# Patient Record
Sex: Female | Born: 1995 | Race: White | Hispanic: No | Marital: Single | Smoking: Never smoker
Health system: Southern US, Community
[De-identification: ages and names within clinical notes are randomized; demographics above are authoritative.]

## PROBLEM LIST (undated history)

## (undated) HISTORY — PX: STRABISMUS SURGERY: SHX218

---
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
## 2019-07-04 ENCOUNTER — Other Ambulatory Visit: Payer: Self-pay

## 2019-07-04 DIAGNOSIS — Z20822 Contact with and (suspected) exposure to covid-19: Secondary | ICD-10-CM

## 2019-07-06 LAB — NOVEL CORONAVIRUS, NAA: SARS-CoV-2, NAA: NOT DETECTED

## 2019-11-21 ENCOUNTER — Other Ambulatory Visit: Payer: Self-pay

## 2019-11-21 ENCOUNTER — Ambulatory Visit
Admission: EM | Admit: 2019-11-21 | Discharge: 2019-11-21 | Disposition: A | Discharge status: Home / Self Care | Payer: Worker's Compensation | Attending: Emergency Medicine | Admitting: Emergency Medicine

## 2019-11-21 ENCOUNTER — Ambulatory Visit: Payer: Self-pay

## 2019-11-21 ENCOUNTER — Ambulatory Visit (INDEPENDENT_AMBULATORY_CARE_PROVIDER_SITE_OTHER): Payer: Worker's Compensation

## 2019-11-21 DIAGNOSIS — S6991XA Unspecified injury of right wrist, hand and finger(s), initial encounter: Secondary | ICD-10-CM | POA: Diagnosis not present

## 2019-11-21 DIAGNOSIS — S8991XA Unspecified injury of right lower leg, initial encounter: Secondary | ICD-10-CM

## 2019-11-21 DIAGNOSIS — M25561 Pain in right knee: Secondary | ICD-10-CM

## 2019-11-21 DIAGNOSIS — M79641 Pain in right hand: Secondary | ICD-10-CM

## 2019-11-21 DIAGNOSIS — W19XXXA Unspecified fall, initial encounter: Secondary | ICD-10-CM

## 2019-11-21 DIAGNOSIS — S46812A Strain of other muscles, fascia and tendons at shoulder and upper arm level, left arm, initial encounter: Secondary | ICD-10-CM

## 2019-11-21 MED ORDER — CYCLOBENZAPRINE HCL 10 MG PO TABS: 10.0000 mg | ORAL_TABLET | Freq: Every day | ORAL | 0 refills | Status: DC

## 2019-11-21 MED ORDER — NAPROXEN 500 MG PO TABS: 500.0000 mg | ORAL_TABLET | Freq: Two times a day (BID) | ORAL | 0 refills | Status: DC

## 2019-11-21 NOTE — Discharge Instructions (Addendum)
Continue conservative management of rest, ice, and elevation RT finger splint applied RT knee brace applied Take naproxen as needed for pain relief (may cause abdominal discomfort, ulcers, and GI bleeds avoid taking with other NSAIDs) Take cyclobenzaprine at nighttime for symptomatic relief. Avoid driving or operating heavy machinery while using medication. Follow up with PCP or orthopedist for further evaluation and management Return or go to the ER if you have any new or worsening symptoms (fever, chills, chest pain, redness, swelling, bruising, worsening symptoms despite treatment, etc...)

## 2019-11-21 NOTE — ED Triage Notes (Signed)
Pt fell while at work after tripping on carpet , pt has right knee, right hand and left shoulder pain. Bruising to right first finger noted

## 2019-11-21 NOTE — ED Provider Notes (Signed)
[facility]   [id number] 11/21/19 Arrival Time: 1801  CC: Fall  SUBJECTIVE: History from: patient. Brooke Paul is a 24 y.o. female complains of LT side of neck, RT little finger, and RT knee pain that occurred today. RT finger and RT knee most painful. Symptoms began after fall at work.  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  Localizes the pain to the LT side of neck, RT little finger, and RT knee.  Describes the pain as intermittent and throbbing in character.  Has not tried OTC medication.  Symptoms are made worse with ROM.  Denies similar symptoms in the past.  Complains of associated swelling and bruising.  Denies fever, chills, erythema, weakness, numbness and tingling.  ROS: As per HPI.  All other pertinent ROS negative.     History reviewed. No pertinent past medical history. Past Surgical History:  Procedure Laterality Date  . STRABISMUS SURGERY     No Known Allergies No current facility-administered medications on file prior to encounter.   No current outpatient medications on file prior to encounter.   Social History   Socioeconomic History  . Marital status: Single    Spouse name: Not on file  . Number of children: Not on file  . Years of education: Not on file  . Highest education level: Not on file  Occupational History  . Not on file  Tobacco Use  . Smoking status: Never Smoker  . Smokeless tobacco: Never Used  Substance and Sexual Activity  . Alcohol use: Never  . Drug use: Never  . Sexual activity: Not on file  Other Topics Concern  . Not on file  Social History Narrative  . Not on file   Social Determinants of Health   Financial Resource Strain:   . Difficulty of Paying Living Expenses: Not on file  Food Insecurity:   . Worried About Charity fundraiser in the Last Year: Not on file  . Ran Out of Food in the Last Year: Not on file    Transportation Needs:   . Lack of Transportation (Medical): Not on file  . Lack of Transportation (Non-Medical): Not on file  Physical Activity:   . Days of Exercise per Week: Not on file  . Minutes of Exercise per Session: Not on file  Stress:   . Feeling of Stress : Not on file  Social Connections:   . Frequency of Communication with Friends and Family: Not on file  . Frequency of Social Gatherings with Friends and Family: Not on file  . Attends Religious Services: Not on file  . Active Member of Clubs or Organizations: Not on file  . Attends Archivist Meetings: Not on file  . Marital Status: Not on file  Intimate Partner Violence:   . Fear of Current or Ex-Partner: Not on file  . Emotionally Abused: Not on file  . Physically Abused: Not on file  . Sexually Abused: Not on file   Family History  Problem Relation Age of Onset  . Healthy Mother   . Healthy Father     OBJECTIVE:  Vitals:   11/21/19 1818  BP: 125/80  Pulse: 83  Resp: 18  Temp: 97.9 F (36.6 C)  SpO2: 99%    General appearance: ALERT; in no acute distress.  Head: NCAT Lungs: Normal respiratory effort; CTAB CV: RRR; radial pulse 2+ Musculoskeletal: Neck/ RT  hand/ RT knee Inspection: Ecchymosis and deformity appreciated to fifth distal digit of RT hand; no obvious swelling, ecchymosis, or wound to LT neck/ shoulder or RT knee Palpation: TTP over LT medial aspect of superior trapezius; TTP distal fifth digit of RT hand; diffusely TTP over lateral and anterior RT knee ROM: FROM active and passive Strength: 5/5 shld abduction, 5/5 shld adduction, deferred grip strength, 5/5 hip flexion, 5/5 knee abduction, 5/5 knee adduction, 5/5 knee flexion, 5/5 knee extension Skin: warm and dry Neurologic: Ambulates with antalgic gait; Sensation intact about the upper/ lower extremities Psychological: alert and cooperative; normal mood and affect  DIAGNOSTIC STUDIES:  DG Knee Complete 4 Views  Right  Result Date: 11/21/2019 CLINICAL DATA:  Pain status post fall EXAM: RIGHT KNEE - COMPLETE 4+ VIEW COMPARISON:  None. FINDINGS: No evidence of fracture, dislocation, or joint effusion. No evidence of arthropathy or other focal bone abnormality. Soft tissues are unremarkable. IMPRESSION: Negative. Electronically Signed   By: Katherine Mantle M.D.   On: 11/21/2019 19:01   DG Hand Complete Right  Result Date: 11/21/2019 CLINICAL DATA:  Fall. EXAM: RIGHT HAND - COMPLETE 3+ VIEW COMPARISON:  None. FINDINGS: There is no evidence of fracture or dislocation. There is no evidence of arthropathy or other focal bone abnormality. Soft tissues are unremarkable. IMPRESSION: Negative. Electronically Signed   By: Katherine Mantle M.D.   On: 11/21/2019 19:00    RT knee and hand X-rays negative for bony abnormalities including fracture, or dislocation.  No soft tissue swelling.    I have reviewed the x-rays myself and the radiologist interpretation. I am in agreement with the radiologist interpretation.     ASSESSMENT & PLAN:  1. Finger injury, right, initial encounter   2. Injury of right knee, initial encounter   3. Trapezius strain, left, initial encounter   4. Fall, initial encounter     Meds ordered this encounter  Medications  . naproxen (NAPROSYN) 500 MG tablet    Sig: Take 1 tablet (500 mg total) by mouth 2 (two) times daily.    Dispense:  30 tablet    Refill:  0    Order Specific Question:   Supervising Provider    Answer:   Eustace Moore [[id number]]  . cyclobenzaprine (FLEXERIL) 10 MG tablet    Sig: Take 1 tablet (10 mg total) by mouth at bedtime.    Dispense:  15 tablet    Refill:  0    Order Specific Question:   Supervising Provider    Answer:   Eustace Moore [[id number]]   Continue conservative management of rest, ice, and elevation RT finger splint applied RT knee brace applied Take naproxen as needed for pain relief (may cause abdominal discomfort, ulcers, and GI  bleeds avoid taking with other NSAIDs) Take cyclobenzaprine at nighttime for symptomatic relief. Avoid driving or operating heavy machinery while using medication. Follow up with PCP or orthopedist for further evaluation and management Return or go to the ER if you have any new or worsening symptoms (fever, chills, chest pain, redness, swelling, bruising, worsening symptoms despite treatment, etc...)   Reviewed expectations re: course of current medical issues. Questions answered. Outlined signs and symptoms indicating need for more acute intervention. Patient verbalized understanding. After Visit Summary given.    Rennis Harding, PA-C 11/21/19 1911

## 2019-11-25 ENCOUNTER — Telehealth: Payer: Self-pay | Admitting: Orthopedic Surgery

## 2019-11-25 NOTE — Telephone Encounter (Signed)
I did speak to [facility] and she understands that in order for [place] to file the [employer] we will have to get authorization from them.  I told her that I would call her as soon as I heard back from them.

## 2019-11-25 NOTE — Telephone Encounter (Signed)
Patient's mother Brooke Paul called and wanted to schedule an appointment for [name].  She said that [name] fell while at work and went to Urgent Care.  I told her that I really needed to speak to [name] but she said that she was calling from her work and that [name] was at home.  She asked that I call [name] and speak to [place] or [name] regarding this situation.  I did call and I spoke to [name].  She said that [name] got hurt on Thursday but did not fill out any of their forms.  She said they did receive the form back today from [name].  [name] then emailed it to their [employer].  She said she called and verified that they did receive the form but that it has not been assigned a claim number or adjustor as of this afternoon.  I will try to speak to [name] to let her know what's going on.

## 2019-11-28 NOTE — Telephone Encounter (Signed)
I did call the church yesterday to speak to Eusebio Friendly but she was out of the office for the day.  As of now, I still have not heard back from the patient, the church nor the [employer].  Still waiting to speak to someone regarding this patient to get approval for her to be seen here in our office.

## 2019-12-03 ENCOUNTER — Ambulatory Visit (INDEPENDENT_AMBULATORY_CARE_PROVIDER_SITE_OTHER): Payer: Worker's Compensation | Admitting: Orthopaedic Surgery

## 2019-12-03 ENCOUNTER — Encounter: Payer: Self-pay | Admitting: Orthopaedic Surgery

## 2019-12-03 ENCOUNTER — Other Ambulatory Visit: Payer: Self-pay

## 2019-12-03 VITALS — BP 102/70 | HR 101 | Temp 98.1°F | Ht 64.5 in | Wt 181.0 lb

## 2019-12-03 DIAGNOSIS — M20011 Mallet finger of right finger(s): Secondary | ICD-10-CM | POA: Diagnosis not present

## 2019-12-03 NOTE — Progress Notes (Signed)
Subjective:    Patient ID: Brooke Paul, female    DOB: 1996/09/14, 24 y.o.   MRN: [id number]  HPI She had a fall on 11-21-19 and injured her right little finger and right knee and left neck.  She was seen in the ER.  I have reviewed the notes and x-rays.  I have independently reviewed and interpreted x-rays of this patient done at another site by another physician or qualified health professional.  She has pain of the right little finger now at the DIP joint.  It does not fully extend.  She has a mallet finger.  They applied a splint in the ER.   Review of Systems  Constitutional: Positive for activity change.  Musculoskeletal: Positive for arthralgias.  All other systems reviewed and are negative.  For Review of Systems, all other systems reviewed and are negative.  The following is a summary of the past history medically, past history surgically, known current medicines, social history and family history.  This information is gathered electronically by the computer from prior information and documentation.  I review this each visit and have found including this information at this point in the chart is beneficial and informative.   History reviewed. No pertinent past medical history.  Past Surgical History:  Procedure Laterality Date  . STRABISMUS SURGERY      Current Outpatient Medications on File Prior to Visit  Medication Sig Dispense Refill  . cyclobenzaprine (FLEXERIL) 10 MG tablet Take 1 tablet (10 mg total) by mouth at bedtime. (Patient not taking: Reported on 12/03/2019) 15 tablet 0  . naproxen (NAPROSYN) 500 MG tablet Take 1 tablet (500 mg total) by mouth 2 (two) times daily. (Patient not taking: Reported on 12/03/2019) 30 tablet 0   No current facility-administered medications on file prior to visit.    Social History   Socioeconomic History  . Marital status: Single    Spouse name: Not on file  . Number of children: Not on file  . Years of education: Not on  file  . Highest education level: Not on file  Occupational History  . Not on file  Tobacco Use  . Smoking status: Never Smoker  . Smokeless tobacco: Never Used  Substance and Sexual Activity  . Alcohol use: Never  . Drug use: Never  . Sexual activity: Not on file  Other Topics Concern  . Not on file  Social History Narrative  . Not on file   Social Determinants of Health   Financial Resource Strain:   . Difficulty of Paying Living Expenses: Not on file  Food Insecurity:   . Worried About Charity fundraiser in the Last Year: Not on file  . Ran Out of Food in the Last Year: Not on file  Transportation Needs:   . Lack of Transportation (Medical): Not on file  . Lack of Transportation (Non-Medical): Not on file  Physical Activity:   . Days of Exercise per Week: Not on file  . Minutes of Exercise per Session: Not on file  Stress:   . Feeling of Stress : Not on file  Social Connections:   . Frequency of Communication with Friends and Family: Not on file  . Frequency of Social Gatherings with Friends and Family: Not on file  . Attends Religious Services: Not on file  . Active Member of Clubs or Organizations: Not on file  . Attends Archivist Meetings: Not on file  . Marital Status: Not on file  Intimate  Partner Violence:   . Fear of Current or Ex-Partner: Not on file  . Emotionally Abused: Not on file  . Physically Abused: Not on file  . Sexually Abused: Not on file    Family History  Problem Relation Age of Onset  . Healthy Mother   . Healthy Father     BP 102/70   Pulse (!) 101   Temp 98.1 F (36.7 C)   Ht 5' 4.5" (1.638 m)   Wt 181 lb (82.1 kg)   BMI 30.59 kg/m   Body mass index is 30.59 kg/m.      Objective:   Physical Exam Vitals and nursing note reviewed.  Constitutional:      Appearance: She is well-developed.  HENT:     Head: Normocephalic and atraumatic.  Eyes:     Conjunctiva/sclera: Conjunctivae normal.     Pupils: Pupils are  equal, round, and reactive to light.  Cardiovascular:     Rate and Rhythm: Normal rate and regular rhythm.  Pulmonary:     Effort: Pulmonary effort is normal.  Abdominal:     Palpations: Abdomen is soft.  Musculoskeletal:       Hands:     Cervical back: Normal range of motion and neck supple.  Skin:    General: Skin is warm and dry.  Neurological:     Mental Status: She is alert and oriented to person, place, and time.     Cranial Nerves: No cranial nerve deficit.     Motor: No abnormal muscle tone.     Coordination: Coordination normal.     Deep Tendon Reflexes: Reflexes are normal and symmetric. Reflexes normal.  Psychiatric:        Behavior: Behavior normal.        Thought Content: Thought content normal.        Judgment: Judgment normal.           Assessment & Plan:   Encounter Diagnosis  Name Primary?  . 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 She will need to be splinted for six to eight weeks.  She may end up with a small extension lag.  Return in one week.  Call if any problem.  Precautions discussed.   Electronically Signed Darreld Mclean, MD 3/9/20213:47 PM

## 2019-12-17 ENCOUNTER — Ambulatory Visit (INDEPENDENT_AMBULATORY_CARE_PROVIDER_SITE_OTHER): Payer: Worker's Compensation | Admitting: Orthopaedic Surgery

## 2019-12-17 ENCOUNTER — Encounter: Payer: Self-pay | Admitting: Orthopaedic Surgery

## 2019-12-17 ENCOUNTER — Other Ambulatory Visit: Payer: Self-pay

## 2019-12-17 VITALS — BP 122/79 | HR 89 | Ht 64.5 in | Wt 178.0 lb

## 2019-12-17 DIAGNOSIS — M20011 Mallet finger of right finger(s): Secondary | ICD-10-CM | POA: Diagnosis not present

## 2019-12-17 NOTE — Progress Notes (Signed)
My finger tingles some times  She has been using the splint for the right little finger but not keeping her finger fully extended.  I have explained again how to do this.  Splint reapplied.  New tape given for her home use.  Skin is OK.  NV intact.  Encounter Diagnosis  Name Primary?  . 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  Continue splinting all the time.  Call if any problem.  Precautions discussed.   Electronically Signed Darreld Mclean, MD 3/23/20218:16 AM

## 2019-12-31 ENCOUNTER — Ambulatory Visit (INDEPENDENT_AMBULATORY_CARE_PROVIDER_SITE_OTHER): Payer: Worker's Compensation | Admitting: Orthopaedic Surgery

## 2019-12-31 ENCOUNTER — Encounter: Payer: Self-pay | Admitting: Orthopaedic Surgery

## 2019-12-31 ENCOUNTER — Other Ambulatory Visit: Payer: Self-pay

## 2019-12-31 VITALS — BP 111/63 | HR 94 | Ht 64.5 in | Wt 178.0 lb

## 2019-12-31 DIAGNOSIS — M20011 Mallet finger of right finger(s): Secondary | ICD-10-CM | POA: Diagnosis not present

## 2019-12-31 NOTE — Progress Notes (Signed)
She is not using the splint appropriately for the right little finger.  I have shown her before how to tape it and wear the splint all the time.    She comes in with the splint barely on and an extension lag of 20 degrees.  NV intact.  I have re-instructed her on how to use the splint.  It is reapplied properly.  Return in one week.  Encounter Diagnosis  Name Primary?  . 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  Precautions discussed.   Electronically Signed Darreld Mclean, MD 4/6/20218:26 AM

## 2020-01-07 ENCOUNTER — Encounter: Payer: Self-pay | Admitting: Orthopaedic Surgery

## 2020-01-07 ENCOUNTER — Other Ambulatory Visit: Payer: Self-pay

## 2020-01-07 ENCOUNTER — Ambulatory Visit (INDEPENDENT_AMBULATORY_CARE_PROVIDER_SITE_OTHER): Payer: Worker's Compensation | Admitting: Orthopaedic Surgery

## 2020-01-07 VITALS — BP 113/79 | HR 91 | Ht 64.5 in | Wt 183.0 lb

## 2020-01-07 DIAGNOSIS — M20011 Mallet finger of right finger(s): Secondary | ICD-10-CM | POA: Diagnosis not present

## 2020-01-07 NOTE — Progress Notes (Signed)
My finger is better  She has been using the splint all the time now. She still has some extension lag of about 10 degrees.    I re-taped her finger with the splint.  Encounter Diagnosis  Name Primary?  . 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  Call if any problem.  Precautions discussed.   Electronically Signed Darreld Mclean, MD 4/13/20218:30 AM

## 2020-01-21 ENCOUNTER — Ambulatory Visit (INDEPENDENT_AMBULATORY_CARE_PROVIDER_SITE_OTHER): Payer: Worker's Compensation | Admitting: Orthopaedic Surgery

## 2020-01-21 ENCOUNTER — Encounter: Payer: Self-pay | Admitting: Orthopaedic Surgery

## 2020-01-21 ENCOUNTER — Ambulatory Visit: Payer: Worker's Compensation

## 2020-01-21 ENCOUNTER — Other Ambulatory Visit: Payer: Self-pay

## 2020-01-21 VITALS — BP 121/78 | HR 81 | Ht 64.5 in | Wt 183.0 lb

## 2020-01-21 DIAGNOSIS — M20011 Mallet finger of right finger(s): Secondary | ICD-10-CM | POA: Diagnosis not present

## 2020-01-21 NOTE — Progress Notes (Signed)
My finger is a little sore  She still has an extension lag of the little finger on the right of about 12 degrees.  NV intact.    X-rays were done, reported separately.  Encounter Diagnosis  Name Primary?  . 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  Use hand creme on the finger.  Return in two weeks.  I have offered for her to see hand surgeon.  Call if any problem.  Precautions discussed.   Electronically Signed Darreld Mclean, MD 4/27/20218:44 AM

## 2020-02-04 ENCOUNTER — Ambulatory Visit (INDEPENDENT_AMBULATORY_CARE_PROVIDER_SITE_OTHER): Payer: Worker's Compensation | Admitting: Orthopaedic Surgery

## 2020-02-04 ENCOUNTER — Encounter: Payer: Self-pay | Admitting: Orthopaedic Surgery

## 2020-02-04 ENCOUNTER — Other Ambulatory Visit: Payer: Self-pay

## 2020-02-04 VITALS — BP 101/83 | HR 82 | Temp 97.6°F | Ht 64.5 in | Wt 187.4 lb

## 2020-02-04 DIAGNOSIS — M20011 Mallet finger of right finger(s): Secondary | ICD-10-CM

## 2020-02-04 NOTE — Progress Notes (Signed)
My finger does not hurt  She has a mallet deformity of about 12 to 15 degrees extension lag of the right dominant little finger.  I have told her this is permanent.  I have offered her to see a hand surgeon but she declines.    She has no pain, no redness.  NV intact.  Encounter Diagnosis  Name Primary?  . 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  Electronically Signed Darreld Mclean, MD 5/11/20218:40 AM

## 2020-12-17 ENCOUNTER — Ambulatory Visit
Admission: EM | Admit: 2020-12-17 | Discharge: 2020-12-17 | Disposition: A | Discharge status: Home / Self Care | Payer: BC Managed Care – PPO | Attending: Emergency Medicine | Admitting: Emergency Medicine

## 2020-12-17 ENCOUNTER — Other Ambulatory Visit: Payer: Self-pay

## 2020-12-17 ENCOUNTER — Encounter: Payer: Self-pay | Admitting: Emergency Medicine

## 2020-12-17 ENCOUNTER — Ambulatory Visit (INDEPENDENT_AMBULATORY_CARE_PROVIDER_SITE_OTHER): Payer: Self-pay

## 2020-12-17 DIAGNOSIS — S59912A Unspecified injury of left forearm, initial encounter: Secondary | ICD-10-CM

## 2020-12-17 DIAGNOSIS — M79632 Pain in left forearm: Secondary | ICD-10-CM

## 2020-12-17 DIAGNOSIS — W19XXXA Unspecified fall, initial encounter: Secondary | ICD-10-CM

## 2020-12-17 MED ORDER — MELOXICAM 15 MG PO TABS
15.0000 mg | ORAL_TABLET | Freq: Every day | ORAL | 0 refills | Status: DC
Start: 2020-12-17 — End: 2022-08-09

## 2020-12-17 NOTE — ED Triage Notes (Signed)
Larey Seat today on left arm. Pain from wrist to above elbow.

## 2020-12-17 NOTE — Discharge Instructions (Addendum)
X-rays negative for fracture or dislocation Ace bandage applied Continue conservative management of rest, ice, and elevation Take mobic  as needed for pain relief (may cause abdominal discomfort, ulcers, and GI bleeds avoid taking with other NSAIDs) Follow up with PCP if symptoms persist Return or go to the ER if you have any new or worsening symptoms (fever, chills, chest pain, redness, swelling, bruising, numbness/ tingling, etc...)

## 2020-12-17 NOTE — ED Provider Notes (Signed)
[facility]   [id number] 12/17/20 Arrival Time: 1706  CC: LT forearm PAIN  SUBJECTIVE: History from: patient. Jaqulyn Ceniyah Thorp is a 25 y.o. female complains of left arm pain that occurred today. Fall while at work on LT forearm.  Works at a daycare.  Localizes the pain to the forearm.  Describes the pain as constant and sharp in character.  Has NOT tried OTC medications.  Symptoms are made worse with ROM about the wrist.  Denies similar symptoms in the past.  Denies fever, chills, erythema, ecchymosis, effusion, weakness, numbness and tingling.  ROS: As per HPI.  All other pertinent ROS negative.     History reviewed. No pertinent past medical history. Past Surgical History:  Procedure Laterality Date  . STRABISMUS SURGERY     No Known Allergies No current facility-administered medications on file prior to encounter.   No current outpatient medications on file prior to encounter.   Social History   Socioeconomic History  . Marital status: Single    Spouse name: Not on file  . Number of children: Not on file  . Years of education: Not on file  . Highest education level: Not on file  Occupational History  . Not on file  Tobacco Use  . Smoking status: Never Smoker  . Smokeless tobacco: Never Used  Substance and Sexual Activity  . Alcohol use: Never  . Drug use: Never  . Sexual activity: Not on file  Other Topics Concern  . Not on file  Social History Narrative  . Not on file   Social Determinants of Health   Financial Resource Strain: Not on file  Food Insecurity: Not on file  Transportation Needs: Not on file  Physical Activity: Not on file  Stress: Not on file  Social Connections: Not on file  Intimate Partner Violence: Not on file   Family History  Problem Relation Age of Onset  . Healthy Mother   . Healthy Father     OBJECTIVE:  Vitals:   12/17/20 1727  BP: 102/67  Pulse: 67  Resp: 18  Temp: 97.6 F (36.4 C)  TempSrc: Oral  SpO2:  99%    General appearance: ALERT; in no acute distress.  Head: NCAT Lungs: Normal respiratory effort CV: Radial pulse 2+ Musculoskeletal: LT foreram Inspection: Skin warm, dry, clear and intact without obvious erythema, effusion, or ecchymosis.  Palpation: diffusely TTP over mid to proximal forearm ROM: FROM active and passive Strength: 4+/5 grip strength Skin: warm and dry Neurologic: Ambulates without difficulty; Sensation intact about the upper/ lower extremities Psychological: alert and cooperative; normal mood and affect  DIAGNOSTIC STUDIES:  DG Forearm Left  Result Date: 12/17/2020 CLINICAL DATA:  Pain following fall EXAM: LEFT FOREARM - 2 VIEW COMPARISON:  None. FINDINGS: Frontal and lateral views were obtained. No fracture or dislocation. Joint spaces appear normal. No erosive change. IMPRESSION: No fracture or dislocation.  No evident arthropathy. Electronically Signed   By: Bretta Bang III M.D.   On: 12/17/2020 17:49    X-rays negative for bony abnormalities including fracture, or dislocation.    I have reviewed the x-rays myself and the radiologist interpretation. I am in agreement with the radiologist interpretation.     ASSESSMENT & PLAN:  1. Left forearm pain   2. Forearm injury, left, initial encounter     Meds ordered this encounter  Medications  . meloxicam (MOBIC) 15 MG tablet    Sig: Take 1 tablet (15 mg total) by mouth daily.  Dispense:  30 tablet    Refill:  0    Order Specific Question:   Supervising Provider    Answer:   Eustace Moore [[id number]]   X-rays negative for fracture or dislocation Ace bandage applied Continue conservative management of rest, ice, and elevation Take mobic  as needed for pain relief (may cause abdominal discomfort, ulcers, and GI bleeds avoid taking with other NSAIDs) Follow up with PCP if symptoms persist Return or go to the ER if you have any new or worsening symptoms (fever, chills, chest pain, redness,  swelling, bruising, numbness/ tingling, etc...)   Reviewed expectations re: course of current medical issues. Questions answered. Outlined signs and symptoms indicating need for more acute intervention. Patient verbalized understanding. After Visit Summary given.    Rennis Harding, PA-C 12/17/20 1757

## 2021-03-02 ENCOUNTER — Other Ambulatory Visit: Payer: Self-pay

## 2021-03-02 ENCOUNTER — Ambulatory Visit (INDEPENDENT_AMBULATORY_CARE_PROVIDER_SITE_OTHER): Payer: Worker's Compensation | Admitting: Orthopaedic Surgery

## 2021-03-02 ENCOUNTER — Encounter: Payer: Self-pay | Admitting: Orthopaedic Surgery

## 2021-03-02 VITALS — BP 112/73 | HR 83 | Ht 64.5 in | Wt 198.0 lb

## 2021-03-02 DIAGNOSIS — M20011 Mallet finger of right finger(s): Secondary | ICD-10-CM | POA: Diagnosis not present

## 2021-03-02 NOTE — Progress Notes (Signed)
My finger droops.  She had a mallet finger on the right little finger at the DIP joint.  I saw her last year for this.  She has a decided lag of about 40 to 45 degrees on the right.  It bothers her.  I had mentioned about possible surgery for the finger.   She has a worker's compensation injury for the finger.  They wanted me to see her and see if surgery was needed.  Since she has continued problems and it gets in her way, an arthrodesis should be considered.  I have explained the procedure to the patient and her mother who is present.    I will have hand surgery to see her and see if she is a candidate for surgery of the mallet finger on the right little finger.  We need to get permission from worker's comp for the referral.  I will see her as needed.  Encounter Diagnosis  Name Primary?  . 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  Precautions discussed.   Electronically Signed Darreld Mclean, MD 6/7/202211:14 AM

## 2021-12-13 ENCOUNTER — Ambulatory Visit
Admission: RE | Admit: 2021-12-13 | Discharge: 2021-12-13 | Disposition: A | Discharge status: Home / Self Care | Payer: BC Managed Care – PPO | Source: Ambulatory Visit | Attending: Student | Admitting: Student

## 2021-12-13 ENCOUNTER — Other Ambulatory Visit: Payer: Self-pay

## 2021-12-13 VITALS — BP 120/82 | HR 103 | Temp 98.1°F | Resp 22

## 2021-12-13 DIAGNOSIS — J0100 Acute maxillary sinusitis, unspecified: Secondary | ICD-10-CM

## 2021-12-13 MED ORDER — ONDANSETRON 8 MG PO TBDP: 8.0000 mg | ORAL_TABLET | Freq: Three times a day (TID) | ORAL | 0 refills | Status: DC | PRN

## 2021-12-13 MED ORDER — AMOXICILLIN 875 MG PO TABS: 875.0000 mg | ORAL_TABLET | Freq: Two times a day (BID) | ORAL | 0 refills | Status: AC

## 2021-12-13 NOTE — ED Triage Notes (Signed)
Pt presents with c/o fever, vomiting and cough that began of Friday  ?

## 2021-12-13 NOTE — ED Provider Notes (Signed)
?[facility] ? ? ? ?CSN: [id number] ?Arrival date & time: 12/13/21  0844 ? ? ?  ? ?History   ?Chief Complaint ?Chief Complaint  ?Patient presents with  ? Emesis  ? Fever  ? ? ?HPI ?Brooke Paul is a 26 y.o. female presenting with nausea, vomiting, subjective chills.  History noncontributory.  Describes nausea with bilious vomiting and loose stool.  Minimal abdominal pain.  Tolerating fluids but not food.  Also with left ear pressure, without dizziness or hearing changes.  States she tried some over-the-counter drops without relief.  Notes nasal congestion and sinus pressure that seemed to start prior to the other symptoms, difficulty breathing out of her nose.  Denies shortness of breath, chest pain. ? ?HPI ? ?History reviewed. No pertinent past medical history. ? ?There are no problems to display for this patient. ? ? ?Past Surgical History:  ?Procedure Laterality Date  ? STRABISMUS SURGERY    ? ? ?OB History   ?No obstetric history on file. ?  ? ? ? ?Home Medications   ? ?Prior to Admission medications   ?Medication Sig Start Date End Date Taking? Authorizing Provider  ?amoxicillin (AMOXIL) 875 MG tablet Take 1 tablet (875 mg total) by mouth 2 (two) times daily for 7 days. 12/13/21 12/20/21 Yes Rhys Martini, PA-C  ?ondansetron (ZOFRAN-ODT) 8 MG disintegrating tablet Take 1 tablet (8 mg total) by mouth every 8 (eight) hours as needed for nausea or vomiting. 12/13/21  Yes Rhys Martini, PA-C  ?meloxicam (MOBIC) 15 MG tablet Take 1 tablet (15 mg total) by mouth daily. 12/17/20   Rennis Harding, PA-C  ? ? ?Family History ?Family History  ?Problem Relation Age of Onset  ? Healthy Mother   ? Healthy Father   ? ? ?Social History ?Social History  ? ?Tobacco Use  ? Smoking status: Never  ? Smokeless tobacco: Never  ?Substance Use Topics  ? Alcohol use: Never  ? Drug use: Never  ? ? ? ?Allergies   ?Patient has no known allergies. ? ? ?Review of Systems ?Review of Systems  ?HENT:  Positive for sinus  pressure.   ?Gastrointestinal:  Positive for nausea and vomiting.  ?All other systems reviewed and are negative. ? ? ?Physical Exam ?Triage Vital Signs ?ED Triage Vitals [12/13/21 0917]  ?Enc Vitals Group  ?   BP 120/82  ?   Pulse Rate (!) 103  ?   Resp (!) 22  ?   Temp 98.1 ?F (36.7 ?C)  ?   Temp src   ?   SpO2 96 %  ?   Weight   ?   Height   ?   Head Circumference   ?   Peak Flow   ?   Pain Score   ?   Pain Loc   ?   Pain Edu?   ?   Excl. in GC?   ? ?No data found. ? ?Updated Vital Signs ?BP 120/82   Pulse (!) 103   Temp 98.1 ?F (36.7 ?C)   Resp (!) 22   LMP 11/29/2021   SpO2 96%  ? ?Visual Acuity ?Right Eye Distance:   ?Left Eye Distance:   ?Bilateral Distance:   ? ?Right Eye Near:   ?Left Eye Near:    ?Bilateral Near:    ? ?Physical Exam ?Vitals reviewed.  ?Constitutional:   ?   General: She is not in acute distress. ?   Appearance: Normal appearance. She is not ill-appearing.  ?HENT:  ?  Head: Normocephalic and atraumatic.  ?   Right Ear: Hearing and ear canal normal. No swelling or tenderness. No middle ear effusion. There is impacted cerumen.  ?   Left Ear: Hearing, tympanic membrane, ear canal and external ear normal. No swelling or tenderness.  No middle ear effusion. There is impacted cerumen. No mastoid tenderness. Tympanic membrane is not injected, scarred, perforated, erythematous, retracted or bulging.  ?   Ears:  ?   Comments: R TM completely occluded by cerumen. L TM 60% occluded by cerumen. ?   Nose:  ?   Right Sinus: Maxillary sinus tenderness present. No frontal sinus tenderness.  ?   Left Sinus: Maxillary sinus tenderness present. No frontal sinus tenderness.  ?   Mouth/Throat:  ?   Mouth: Mucous membranes are moist.  ?   Pharynx: Oropharynx is clear. No oropharyngeal exudate or posterior oropharyngeal erythema.  ?   Comments: Moist mucous membranes ?Eyes:  ?   Extraocular Movements: Extraocular movements intact.  ?   Pupils: Pupils are equal, round, and reactive to light.  ?Cardiovascular:   ?   Rate and Rhythm: Normal rate and regular rhythm.  ?   Heart sounds: Normal heart sounds.  ?Pulmonary:  ?   Effort: Pulmonary effort is normal.  ?   Breath sounds: Normal breath sounds. No wheezing, rhonchi or rales.  ?Abdominal:  ?   General: Bowel sounds are normal. There is no distension.  ?   Palpations: Abdomen is soft. There is no mass.  ?   Tenderness: There is no abdominal tenderness. There is no right CVA tenderness, left CVA tenderness, guarding or rebound.  ?Lymphadenopathy:  ?   Cervical: No cervical adenopathy.  ?Skin: ?   General: Skin is warm.  ?   Capillary Refill: Capillary refill takes less than 2 seconds.  ?   Comments: Good skin turgor  ?Neurological:  ?   General: No focal deficit present.  ?   Mental Status: She is alert and oriented to person, place, and time.  ?Psychiatric:     ?   Mood and Affect: Mood normal.     ?   Behavior: Behavior normal.     ?   Thought Content: Thought content normal.     ?   Judgment: Judgment normal.  ? ? ? ?UC Treatments / Results  ?Labs ?(all labs ordered are listed, but only abnormal results are displayed) ?Labs Reviewed - No data to display ? ?EKG ? ? ?Radiology ?No results found. ? ?Procedures ?Procedures (including critical care time) ? ?Medications Ordered in UC ?Medications - No data to display ? ?Initial Impression / Assessment and Plan / UC Course  ?I have reviewed the triage vital signs and the nursing notes. ? ?Pertinent labs & imaging results that were available during my care of the patient were reviewed by me and considered in my medical decision making (see chart for details). ? ?  ? ?This patient is a very pleasant 26 y.o. year old female presenting with viral syndrome x4 days.  Afebrile, borderline tachycardic.  Appears fairly well-hydrated. LMP 11/29/21, States she is not pregnant or breastfeeding. ? ?Pt with maxillary sinus tenderness and congestion that started prior to onset of other symptoms. She denies allergic rhinitis; nonetheless I  suspect that her sinusitis may be related to this. Amoxicillin sent. Zofran for nausea and vomiting.  ? ?Work note provided. ED return precautions discussed. Patient verbalizes understanding and agreement.  ? ? ?Final Clinical Impressions(s) / UC Diagnoses  ? ?  Final diagnoses:  ?Acute non-recurrent maxillary sinusitis  ? ? ? ?Discharge Instructions   ? ?  ?-Amoxicillin twice daily x7 days. Take with food if you have a sensitive stomach . ?-Take the Zofran (ondansetron) up to 3 times daily for nausea and vomiting. Dissolve one pill under your tongue or between your teeth and your cheek. ?-Drink plenty of fluids and eat a bland diet  ?-With a virus, you're typically contagious for 5-7 days, or as long as you're having fevers.  ? ? ? ?ED Prescriptions   ? ? Medication Sig Dispense Auth. Provider  ? amoxicillin (AMOXIL) 875 MG tablet Take 1 tablet (875 mg total) by mouth 2 (two) times daily for 7 days. 14 tablet Rhys MartiniGraham, Naveen Lorusso E, PA-C  ? ondansetron (ZOFRAN-ODT) 8 MG disintegrating tablet Take 1 tablet (8 mg total) by mouth every 8 (eight) hours as needed for nausea or vomiting. 20 tablet Rhys MartiniGraham, Jackee Glasner E, PA-C  ? ?  ? ?PDMP not reviewed this encounter. ?  ?Rhys MartiniGraham, Tniya Bowditch E, PA-C ?12/13/21 [id number] ? ?

## 2021-12-13 NOTE — ED Triage Notes (Signed)
Pt reports 2 negative covid test  ?

## 2021-12-13 NOTE — Discharge Instructions (Addendum)
-  Amoxicillin twice daily x7 days. Take with food if you have a sensitive stomach . ?-Take the Zofran (ondansetron) up to 3 times daily for nausea and vomiting. Dissolve one pill under your tongue or between your teeth and your cheek. ?-Drink plenty of fluids and eat a bland diet  ?-With a virus, you're typically contagious for 5-7 days, or as long as you're having fevers.  ? ?

## 2022-04-25 IMAGING — DX DG FOREARM 2V*L*
2 series · 2 of 2 positions shown · non-contrast
Comparison: None.

CLINICAL DATA: Pain following fall

EXAM:
LEFT FOREARM - 2 VIEW

[forearm ap]
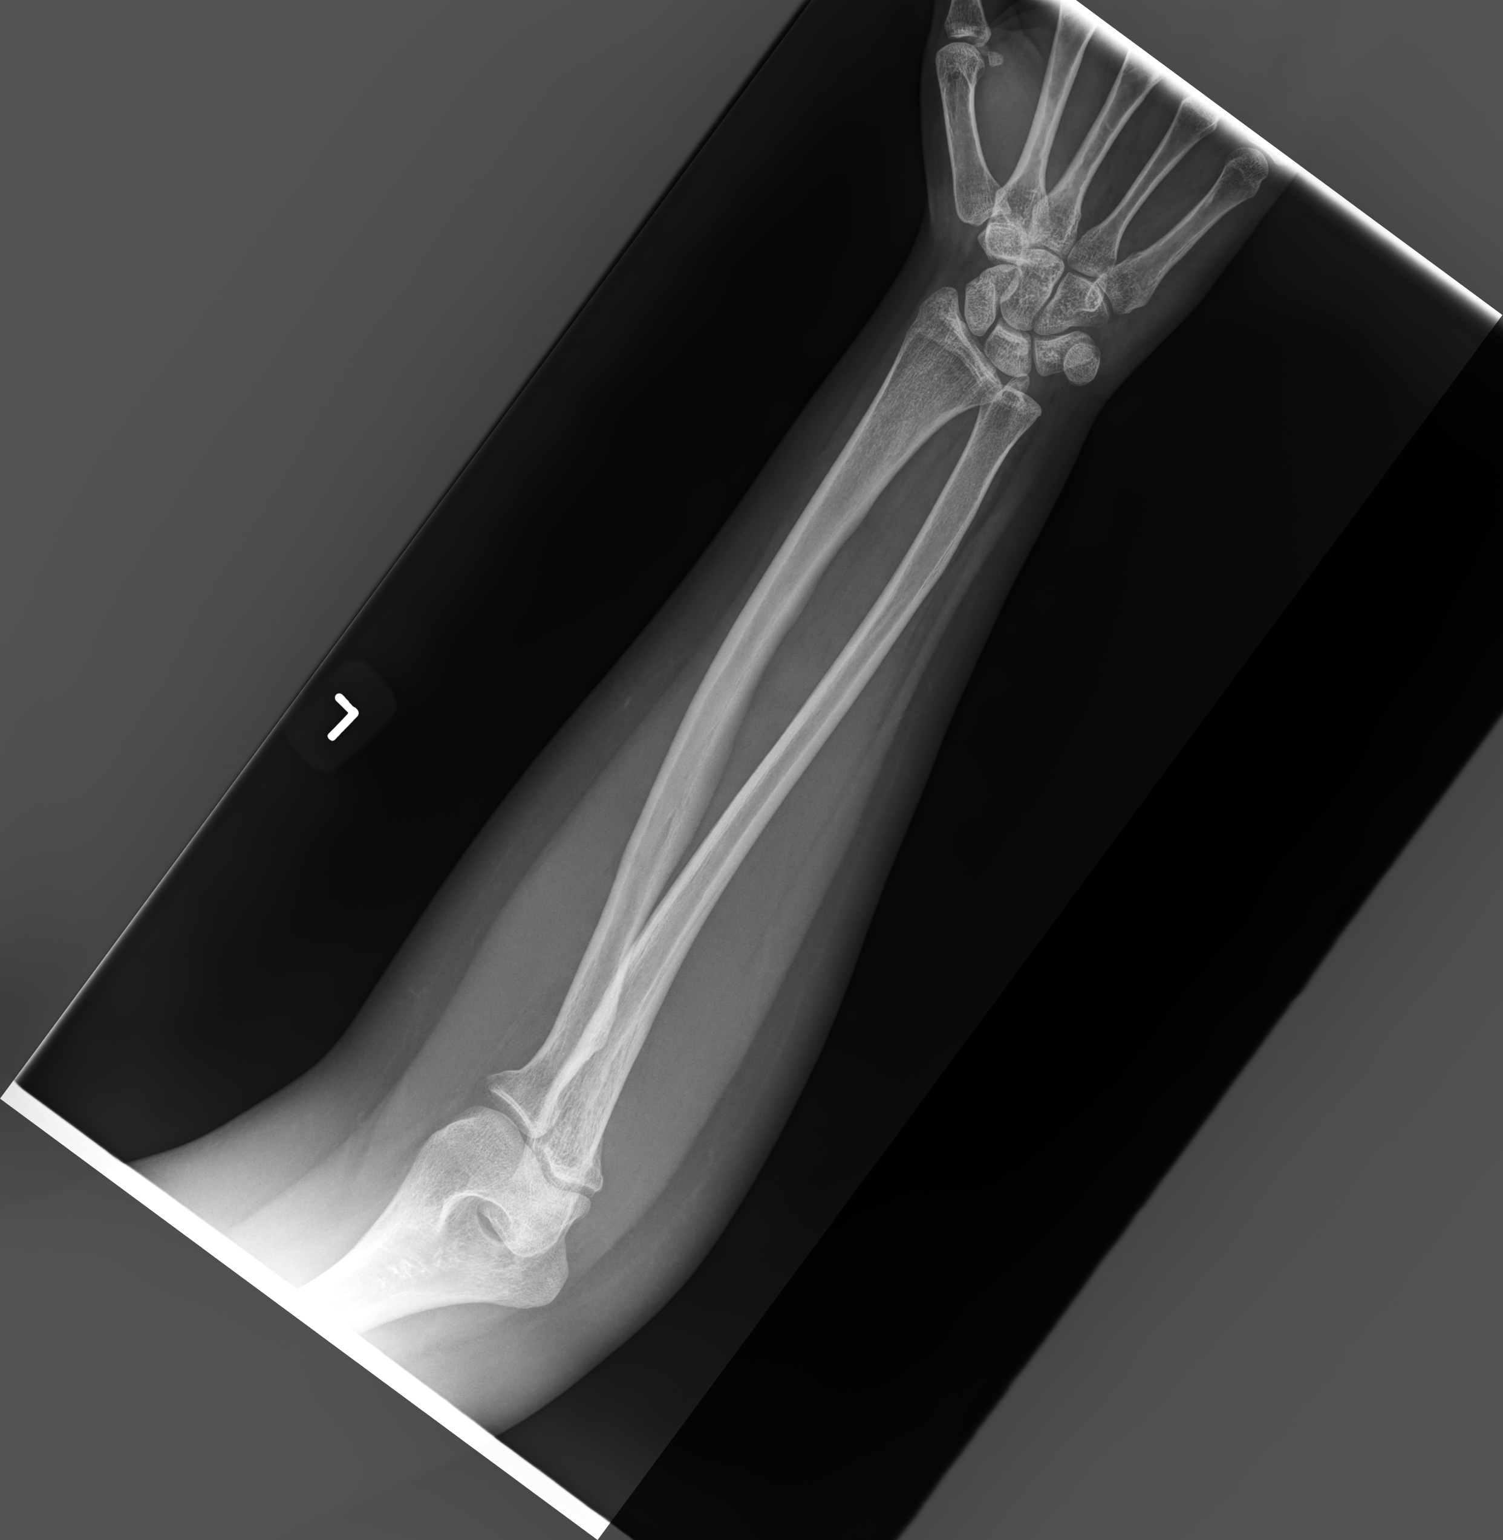

[forearm lat]
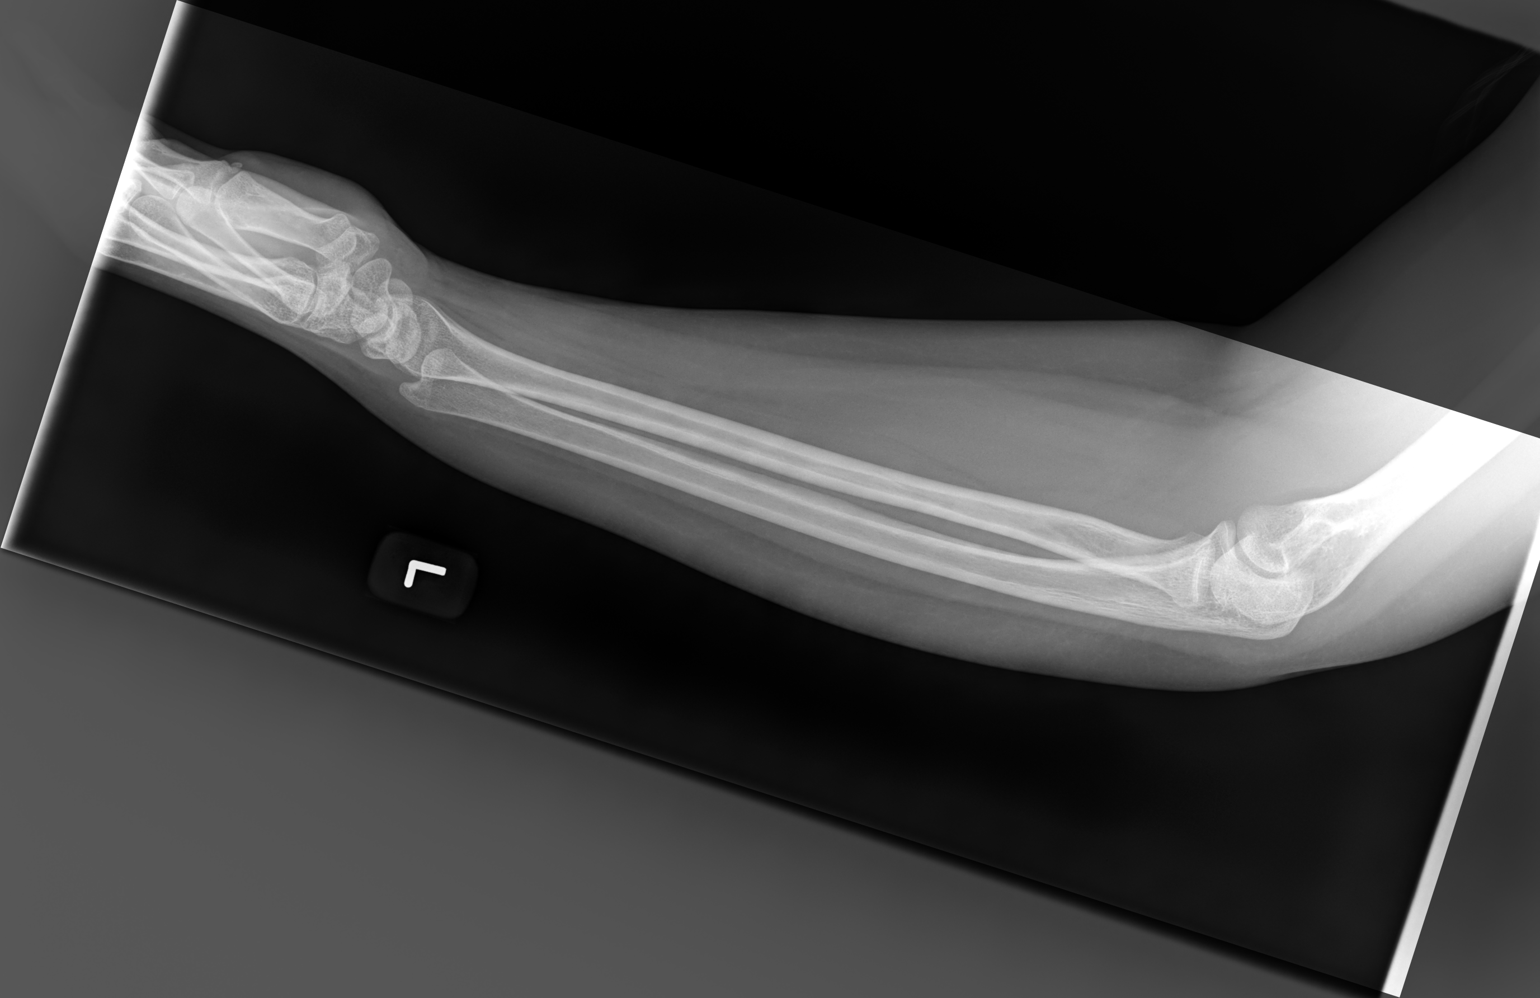

[2 of 2 positions shown; findings below may reference images not displayed]

FINDINGS: Frontal and lateral views were obtained. No fracture or dislocation.
Joint spaces appear normal. No erosive change.
IMPRESSION: No fracture or dislocation.  No evident arthropathy.

## 2022-06-15 ENCOUNTER — Ambulatory Visit: Payer: BC Managed Care – PPO | Admitting: Family Medicine

## 2022-07-07 ENCOUNTER — Ambulatory Visit: Payer: BC Managed Care – PPO | Admitting: Family Medicine

## 2022-07-07 VITALS — BP 130/84 | HR 98 | Temp 98.2°F | Ht 64.5 in | Wt 230.0 lb

## 2022-07-07 DIAGNOSIS — M79661 Pain in right lower leg: Secondary | ICD-10-CM | POA: Diagnosis not present

## 2022-07-07 DIAGNOSIS — E669 Obesity, unspecified: Secondary | ICD-10-CM | POA: Diagnosis not present

## 2022-07-07 DIAGNOSIS — Z13220 Encounter for screening for lipoid disorders: Secondary | ICD-10-CM

## 2022-07-07 DIAGNOSIS — Z0000 Encounter for general adult medical examination without abnormal findings: Secondary | ICD-10-CM

## 2022-07-07 DIAGNOSIS — M79662 Pain in left lower leg: Secondary | ICD-10-CM

## 2022-07-07 NOTE — Patient Instructions (Signed)
Labs today.  X-rays today.  Please schedule appointment with OB GYN.  Take care  Dr. Lacinda Axon

## 2022-07-08 DIAGNOSIS — E669 Obesity, unspecified: Secondary | ICD-10-CM | POA: Insufficient documentation

## 2022-07-08 DIAGNOSIS — M79661 Pain in right lower leg: Secondary | ICD-10-CM | POA: Insufficient documentation

## 2022-07-08 DIAGNOSIS — Z0000 Encounter for general adult medical examination without abnormal findings: Secondary | ICD-10-CM | POA: Insufficient documentation

## 2022-07-08 LAB — HEMOGLOBIN A1C
Est. average glucose Bld gHb Est-mCnc: 91 mg/dL
Hgb A1c MFr Bld: 4.8 % (ref 4.8–5.6)

## 2022-07-08 LAB — LIPID PANEL
Chol/HDL Ratio: 3.5 ratio (ref 0.0–4.4)
Cholesterol, Total: 199 mg/dL (ref 100–199)
HDL: 57 mg/dL (ref >39)
LDL Chol Calc (NIH): 116 mg/dL — ABNORMAL HIGH (ref 0–99)
Triglycerides: 146 mg/dL (ref 0–149)
VLDL Cholesterol Cal: 26 mg/dL (ref 5–40)

## 2022-07-08 LAB — CBC
Hematocrit: 44 % (ref 34.0–46.6)
Hemoglobin: 14.3 g/dL (ref 11.1–15.9)
MCH: 29.4 pg (ref 26.6–33.0)
MCHC: 32.5 g/dL (ref 31.5–35.7)
MCV: 90 fL (ref 79–97)
Platelets: 315 10*3/uL (ref 150–450)
RBC: 4.87 x10E6/uL (ref 3.77–5.28)
RDW: 12.7 % (ref 11.7–15.4)
WBC: 9.2 10*3/uL (ref 3.4–10.8)

## 2022-07-08 LAB — CMP14+EGFR
ALT: 16 IU/L (ref 0–32)
AST: 18 IU/L (ref 0–40)
Albumin/Globulin Ratio: 1.4 (ref 1.2–2.2)
Albumin: 4.3 g/dL (ref 4.0–5.0)
Alkaline Phosphatase: 73 IU/L (ref 44–121)
BUN/Creatinine Ratio: 15 (ref 9–23)
BUN: 11 mg/dL (ref 6–20)
Bilirubin Total: 0.3 mg/dL (ref 0.0–1.2)
CO2: 23 mmol/L (ref 20–29)
Calcium: 10 mg/dL (ref 8.7–10.2)
Chloride: 102 mmol/L (ref 96–106)
Creatinine, Ser: 0.73 mg/dL (ref 0.57–1.00)
Globulin, Total: 3.1 g/dL (ref 1.5–4.5)
Glucose: 80 mg/dL (ref 70–99)
Potassium: 4.4 mmol/L (ref 3.5–5.2)
Sodium: 139 mmol/L (ref 134–144)
Total Protein: 7.4 g/dL (ref 6.0–8.5)
eGFR: 117 mL/min/{1.73_m2} (ref >59)

## 2022-07-08 NOTE — Assessment & Plan Note (Signed)
Unclear etiology.  This is very atypical.  Arranging further evaluation with imaging.

## 2022-07-08 NOTE — Progress Notes (Signed)
Subjective:  Patient ID: Brooke Paul, female    DOB: [birth year]  Age: 26 y.o. MRN: [id number]  CC: Chief Complaint  Patient presents with   Establish Care    Right leg pain from knee down , present since childhood but worsened with weather changes takes tylenol prn     HPI:  26 year old female presents to establish care.  Patient reports that she has had ongoing pain in her lower extremities from the knees down bilaterally.  She states that this has been going on for the past couple of years.  She uses heat and Tylenol with improvement but no resolution.  No recent fall, trauma, injury.  Pain is worse at night.  No reports of fever, chills, night sweats.  Patient is overdue for numerous preventative healthcare items.  Declines vaccines today.  Needs to establish with OB/GYN for Pap smear.  Social Hx   Social History   Socioeconomic History   Marital status: Single    Spouse name: Not on file   Number of children: Not on file   Years of education: Not on file   Highest education level: Not on file  Occupational History   Not on file  Tobacco Use   Smoking status: Never   Smokeless tobacco: Never  Substance and Sexual Activity   Alcohol use: Never   Drug use: Never   Sexual activity: Not on file  Other Topics Concern   Not on file  Social History Narrative   Not on file   Social Determinants of Health   Financial Resource Strain: Not on file  Food Insecurity: Not on file  Transportation Needs: Not on file  Physical Activity: Not on file  Stress: Not on file  Social Connections: Not on file    Review of Systems Per HPI  Objective:  BP 130/84   Pulse 98   Temp 98.2 F (36.8 C)   Ht 5' 4.5" (1.638 m)   Wt 230 lb (104.3 kg)   SpO2 97%   BMI 38.87 kg/m      07/07/2022    2:16 PM 12/13/2021    9:17 AM 03/02/2021    7:54 AM  BP/Weight  Systolic BP 128 786 767  Diastolic BP 84 82 73  Wt. (Lbs) 230  198  BMI 38.87 kg/m2  33.46 kg/m2    Physical  Exam  Lab Results  Component Value Date   WBC 9.2 07/07/2022   HGB 14.3 07/07/2022   HCT 44.0 07/07/2022   PLT 315 07/07/2022   GLUCOSE 80 07/07/2022   CHOL 199 07/07/2022   TRIG 146 07/07/2022   HDL 57 07/07/2022   LDLCALC 116 (H) 07/07/2022   ALT 16 07/07/2022   AST 18 07/07/2022   NA 139 07/07/2022   K 4.4 07/07/2022   CL 102 07/07/2022   CREATININE 0.73 07/07/2022   BUN 11 07/07/2022   CO2 23 07/07/2022   HGBA1C 4.8 07/07/2022     Assessment & Plan:   Problem List Items Addressed This Visit       Other   Obesity (BMI 30-39.9)   Relevant Orders   CMP14+EGFR (Completed)   Hemoglobin A1c (Completed)   Pain in both lower legs - Primary    Unclear etiology.  This is very atypical.  Arranging further evaluation with imaging.      Relevant Orders   CBC (Completed)   DG Tibia/Fibula Left   DG Tibia/Fibula Right   DG Knee Complete 4 Views Left  DG Knee Complete 4 Views Right   Preventative health care    Declines vaccines today.  Advised that she needs cervical cancer screening.  Patient prefers to see OB/GYN. Screening labs today.      Other Visit Diagnoses     Screening, lipid       Relevant Orders   Lipid panel (Completed)      Follow-up: Pending work-up  [place]

## 2022-07-08 NOTE — Assessment & Plan Note (Addendum)
Declines vaccines today.  Advised that she needs cervical cancer screening.  Patient prefers to see OB/GYN. Screening labs today.

## 2022-07-14 ENCOUNTER — Ambulatory Visit (HOSPITAL_COMMUNITY)
Admission: RE | Admit: 2022-07-14 | Discharge: 2022-07-14 | Disposition: A | Discharge status: Home / Self Care | Payer: BC Managed Care – PPO | Source: Ambulatory Visit | Attending: Family Medicine | Admitting: Family Medicine

## 2022-07-14 DIAGNOSIS — M79661 Pain in right lower leg: Secondary | ICD-10-CM | POA: Diagnosis present

## 2022-07-14 DIAGNOSIS — M79662 Pain in left lower leg: Secondary | ICD-10-CM | POA: Diagnosis present

## 2022-07-21 ENCOUNTER — Other Ambulatory Visit: Payer: Self-pay | Admitting: Family Medicine

## 2022-07-21 DIAGNOSIS — M79661 Pain in right lower leg: Secondary | ICD-10-CM

## 2022-08-09 ENCOUNTER — Ambulatory Visit: Payer: BC Managed Care – PPO | Admitting: Orthopedic Surgery

## 2022-08-09 ENCOUNTER — Encounter: Payer: Self-pay | Admitting: Orthopedic Surgery

## 2022-08-09 VITALS — BP 142/73 | HR 82 | Ht 64.5 in | Wt 228.0 lb

## 2022-08-09 DIAGNOSIS — M79661 Pain in right lower leg: Secondary | ICD-10-CM | POA: Diagnosis not present

## 2022-08-09 DIAGNOSIS — M79662 Pain in left lower leg: Secondary | ICD-10-CM

## 2022-08-09 NOTE — Patient Instructions (Addendum)
Physical therapy  Medications like ibuprofen or naproxen  Please contact the clinic if you have any questions or concerns.  Physical therapy has been ordered for you at [facility]. They should call you to schedule, [phone] is the phone number to call, if you want to call to schedule.

## 2022-08-09 NOTE — Progress Notes (Signed)
New Patient Visit  Assessment: Brooke Paul is a 26 y.o. female with the following: 1. Pain in both lower legs  Plan: Brooke Paul has pain in both legs, extending from her patella distally towards her ankle.  Pain gets worse with activity.  Radiographs of the knee and the tibia are negative bilaterally.  No concerning features on physical exam.  Symptoms are not consistent with exertional compartment syndrome, but there could be a component of this causing her discomfort.  I am recommending medications like ibuprofen as needed, and referral to physical therapy.  If she does not have any improvements with these modalities, we may have to consider further discussion regarding a work-up for exertional compartment syndrome.  All questions were answered, and she is in agreement with this plan.  Follow-up: No follow-ups on file.  Subjective:  Chief Complaint  Patient presents with   Leg Pain    Bilat lower leg pain since childhood but getting worse. States R > L, physical activity and cold weather makes it worse.     History of Present Illness: Brooke Paul is a 26 y.o. female who has been referred by  Everlene Other, DO for evaluation of bilateral lower leg pain.  She has had pain in her legs for several years.  Is gotten worse since a fall within the last couple of years.  Pain in the right leg is worse than the left.  Pain starts at the patella, and extends distally towards her ankle.  She states the pain gets worse at the conclusion of physical activity.  Occasionally, she states her legs feel heavy, almost like they are falling asleep when she is active.  She also states the pain gets worse at night.  Her health has remained stable.  No fevers or chills.  No night sweats.  She has been taking Tylenol for pain, as well as some alternative treatment methods.  She states that tonic water helps some of her symptoms.  She is currently between jobs.  She previously worked at a  daycare.   Review of Systems: No fevers or chills Occasional numbness or tingling No chest pain No shortness of breath No bowel or bladder dysfunction No GI distress No headaches   Medical History:  No past medical history on file.  Past Surgical History:  Procedure Laterality Date   STRABISMUS SURGERY      Family History  Problem Relation Age of Onset   Healthy Mother    Healthy Father    Social History   Tobacco Use   Smoking status: Never   Smokeless tobacco: Never  Substance Use Topics   Alcohol use: Never   Drug use: Never    No Known Allergies  Current Meds  Medication Sig   acetaminophen (TYLENOL) 325 MG tablet Take 650 mg by mouth every 6 (six) hours as needed.    Objective: BP (!) 142/73   Pulse 82   Ht 5' 4.5" (1.638 m)   Wt 228 lb (103.4 kg)   BMI 38.53 kg/m   Physical Exam:  General: Alert and oriented. and No acute distress. Gait: Normal gait.  Evaluation of bilateral lower extremities demonstrates no swelling.  No bruising is appreciated.  Well-healed scar over the anterior aspect of the right knee.  Mild tenderness to palpation over the patella.  Mild tenderness to palpation within the TA musculature on the right.  No similar tenderness within the left leg.  Knees are stable to varus and valgus stress.  Negative Lachman bilaterally.  Good Strength in the lower lower extremities bilaterally.  IMAGING: I personally reviewed images previously obtained in clinic  X-rays of both knees, as well as the tibia bilaterally are negative for acute injury or degenerative changes   New Medications:  No orders of the defined types were placed in this encounter.     Oliver Barre, MD  08/09/2022 9:13 AM

## 2022-09-12 NOTE — Therapy (Unsigned)
OUTPATIENT PHYSICAL THERAPY LOWER EXTREMITY EVALUATION   Patient Name: Brooke Paul MRN: [id number] DOB:May 10, 1996, 26 y.o., female Today's Date: 09/13/2022  END OF SESSION:  PT End of Session - 09/13/22 1517     Visit Number 1    Number of Visits 8    Date for PT Re-Evaluation 10/13/22    Authorization Type [employer] state health plane    Progress Note Due on Visit 8    PT Start Time 1520    PT Stop Time 1555    PT Time Calculation (min) 35 min             No past medical history on file. Past Surgical History:  Procedure Laterality Date   STRABISMUS SURGERY     Patient Active Problem List   Diagnosis Date Noted   Pain in both lower legs 07/08/2022   Obesity (BMI 30-39.9) 07/08/2022   Preventative health care 07/08/2022    PCP: Everlene Other  REFERRING PROVIDER: Oliver Barre, MD  REFERRING DIAG: [phone] (ICD-10-CM) - Pain in both lower legs  THERAPY DIAG:  Pain in right leg Pain in left leg Muscle weakness   Rationale for Evaluation and Treatment: Rehabilitation  ONSET DATE: 07/08/22  SUBJECTIVE:   SUBJECTIVE STATEMENT: Pt states that she has had pain in her legs since she was little but it got worse in high school.  The pain subsided and now it has returned for the past 2 years.  Her RT  leg is a lot worse than the left.  The pain is from her knee down to her ankle.    PERTINENT HISTORY: N/A PAIN:  Are you having pain? Yes: NPRS scale: 5/10, worst 7/10, best 0 Pain location: whole LE  Pain description: stabbing pain  Aggravating factors: physical activity, weather  Relieving factors: heat and ice   PRECAUTIONS: None  WEIGHT BEARING RESTRICTIONS: No  FALLS:  Has patient fallen in last 6 months? no  LIVING ENVIRONMENT: Lives with: lives with their family Lives in: House/apartment Stairs: Yes: External: 2 steps; on right going up Has following equipment at home: None  OCCUPATION: unemployed  PLOF: Independent  PATIENT GOALS:  To have less pain in her legs   NEXT MD VISIT: next year   OBJECTIVE:   DIAGNOSTIC FINDINGS: IMPRESSION: Negative right knee radiographs.  IMPRESSION: Negative left knee radiographs.     Electronically Signed   By: Caprice Renshaw M.D.   On: 07/14/2022 08:06   PATIENT SURVEYS:  FOTO 59  COGNITION: Overall cognitive status: Within functional limits for tasks assessed     SENSATION: Not tested  LOWER EXTREMITY ROM:  Active ROM Right eval Left eval  Hip flexion 5 5  Hip extension 3 3  Hip abduction 5 5  Hip adduction    Hip internal rotation    Hip external rotation    Knee flexion 4 4  Knee extension 5 5  Ankle dorsiflexion 4 4+  Ankle plantarflexion    Ankle inversion    Ankle eversion     (Blank rows = not tested)  LOWER EXTREMITY MMT: wfl    FUNCTIONAL TESTS:  30 seconds chair stand test: 9 2 minute walk test: 36 Single leg stance:  Rt: 15", Lt:  11"     Comments: hips adduct with gait    TODAY'S TREATMENT:  DATE: 09/13/22  Evaluation Prone single leg raise x 10 Prone heel squeeze x 10 Bridge x 10  Sit to stand x 10  PATIENT EDUCATION:  Education details: HEP Person educated: Patient Education method: Explanation Education comprehension: verbalized understanding  HOME EXERCISE PROGRAM: Access Code: [email or web address] URL: [email or web address] Date: 09/13/2022 Prepared by: Virgina Organ  Exercises - Prone Hip Extension  - 2 x daily - 7 x weekly - 1 sets - 10 reps - 3-5" hold - Prone Heel Squeeze  - 2 x daily - 7 x weekly - 1 sets - 10 reps - 3-5" hold - Supine Bridge  - 2 x daily - 7 x weekly - 1 sets - 10 reps - 3-5 hold - Sit to Stand  - 2 x daily - 7 x weekly - 1 sets - 10 reps - Mini Squat  - 2 x daily - 7 x weekly - 1 sets - 10 reps - 3-5 hold  ASSESSMENT:  CLINICAL IMPRESSION: Patient is a 26 y.o.  female who was seen today for physical therapy evaluation and treatment for B LE pain. Evaluation demonstrates decreased strength, decreased balance, decreased activity tolerance and increased pain.  Ms. Urich will benefit from skilled PT to address these issues and maximize her functioning ability.   OBJECTIVE IMPAIRMENTS: decreased activity tolerance, decreased balance, difficulty walking, decreased strength, and pain.   ACTIVITY LIMITATIONS: carrying, lifting, squatting, stairs, and locomotion level  PARTICIPATION LIMITATIONS: shopping and community activity  PERSONAL FACTORS: Time since onset of injury/illness/exacerbation are also affecting patient's functional outcome.   REHAB POTENTIAL: Fair    CLINICAL DECISION MAKING: Stable/uncomplicated  EVALUATION COMPLEXITY: Low   GOALS: Goals reviewed with patient? No  SHORT TERM GOALS: Target date: 09/27/22 Pt to be I in HEP in order to decrease her pain to no greater than a 5/10 throughout the day to improve functional mobility Baseline: Goal status: INITIAL  2.  Pt strength in LE to increase 1/2 grade to allow pt to rise from a low couch with greater ease.  Baseline:  Goal status: INITIAL  3.  Pt to be able to be on her feet for an hour without increased knee/leg pain in order to clean the house.  Baseline:  Goal status: INITIAL    LONG TERM GOALS: Target date: 10/11/22  Pt to be I in HEP in order to decrease her pain to no greater than a 3/10 throughout the day to improve functional mobility Baseline:  Goal status: INITIAL  2.   Pt strength in LE to increase 1 grade to allow pt to rise from a squatted position with greater ease. Baseline:  Goal status: INITIAL  3.  Pt to be able to be on her feet for two hours without  increased knee/leg pain for community activity.  Baseline:  Goal status: INITIAL  4.  Pt to be able to single leg stance for 30 seconds B for improved balance Baseline:  Goal status:  INITIAL     PLAN:  PT FREQUENCY: 2x/week  PT DURATION: 4 weeks  PLANNED INTERVENTIONS: Therapeutic exercises, Therapeutic activity, Neuromuscular re-education, Balance training, Gait training, Patient/Family education, Self Care, and Manual therapy  PLAN FOR NEXT SESSION: begin rocker board, step ups, lunging, stretching; progress stability as able.   Virgina Organ, PT CLT [phone]  09/13/2022, 4:00 PM

## 2022-09-13 ENCOUNTER — Ambulatory Visit (HOSPITAL_COMMUNITY): Payer: BC Managed Care – PPO | Attending: Family Medicine | Admitting: Physical Therapy

## 2022-09-13 DIAGNOSIS — M79662 Pain in left lower leg: Secondary | ICD-10-CM | POA: Insufficient documentation

## 2022-09-13 DIAGNOSIS — M6281 Muscle weakness (generalized): Secondary | ICD-10-CM | POA: Diagnosis present

## 2022-09-13 DIAGNOSIS — M25562 Pain in left knee: Secondary | ICD-10-CM | POA: Insufficient documentation

## 2022-09-13 DIAGNOSIS — G8929 Other chronic pain: Secondary | ICD-10-CM | POA: Diagnosis present

## 2022-09-13 DIAGNOSIS — M25561 Pain in right knee: Secondary | ICD-10-CM | POA: Diagnosis present

## 2022-09-13 DIAGNOSIS — M79661 Pain in right lower leg: Secondary | ICD-10-CM | POA: Diagnosis not present

## 2022-09-21 ENCOUNTER — Ambulatory Visit (HOSPITAL_COMMUNITY): Payer: BC Managed Care – PPO

## 2022-09-21 ENCOUNTER — Encounter (HOSPITAL_COMMUNITY): Payer: Self-pay

## 2022-09-21 DIAGNOSIS — G8929 Other chronic pain: Secondary | ICD-10-CM

## 2022-09-21 DIAGNOSIS — M6281 Muscle weakness (generalized): Secondary | ICD-10-CM

## 2022-09-21 DIAGNOSIS — M25561 Pain in right knee: Secondary | ICD-10-CM | POA: Diagnosis not present

## 2022-09-21 NOTE — Therapy (Signed)
OUTPATIENT PHYSICAL THERAPY LOWER EXTREMITY EVALUATION   Patient Name: Brooke Paul MRN: [id number] DOB:08-11-1996, 26 y.o., female Today's Date: 09/21/2022  END OF SESSION:  PT End of Session - 09/21/22 1038     Visit Number 2    Number of Visits 8    Date for PT Re-Evaluation 10/13/22    Authorization Type [employer] state health plane    Progress Note Due on Visit 8    PT Start Time 0950    PT Stop Time 1029    PT Time Calculation (min) 39 min    Activity Tolerance Patient tolerated treatment well    Behavior During Therapy [facility] for tasks assessed/performed              History reviewed. No pertinent past medical history. Past Surgical History:  Procedure Laterality Date   STRABISMUS SURGERY     Patient Active Problem List   Diagnosis Date Noted   Pain in both lower legs 07/08/2022   Obesity (BMI 30-39.9) 07/08/2022   Preventative health care 07/08/2022    PCP: Everlene Other  REFERRING PROVIDER: Oliver Barre, MD  REFERRING DIAG: [phone] (ICD-10-CM) - Pain in both lower legs  THERAPY DIAG:  Pain in right leg Pain in left leg Muscle weakness   Rationale for Evaluation and Treatment: Rehabilitation  ONSET DATE: 07/08/22  SUBJECTIVE:   SUBJECTIVE STATEMENT: 09/21/22:  Pt stated she is feeling good today, no reports of pain today.  Reports compliance with HEP.  Eval Subjective:  Pt states that she has had pain in her legs since she was little but it got worse in high school.  The pain subsided and now it has returned for the past 2 years.  Her RT  leg is a lot worse than the left.  The pain is from her knee down to her ankle.    PERTINENT HISTORY: N/A PAIN:  Are you having pain? Yes: 09/21/22:  0/10.  NPRS scale: 5/10, worst 7/10, best 0 Pain location: whole LE  Pain description: stabbing pain  Aggravating factors: physical activity, weather  Relieving factors: heat and ice   PRECAUTIONS: None  WEIGHT BEARING RESTRICTIONS: No  FALLS:   Has patient fallen in last 6 months? no  LIVING ENVIRONMENT: Lives with: lives with their family Lives in: House/apartment Stairs: Yes: External: 2 steps; on right going up Has following equipment at home: None  OCCUPATION: unemployed  PLOF: Independent  PATIENT GOALS: To have less pain in her legs   NEXT MD VISIT: next year   OBJECTIVE:   DIAGNOSTIC FINDINGS: IMPRESSION: Negative right knee radiographs.  IMPRESSION: Negative left knee radiographs.     Electronically Signed   By: Caprice Renshaw M.D.   On: 07/14/2022 08:06   PATIENT SURVEYS:  FOTO 59  COGNITION: Overall cognitive status: Within functional limits for tasks assessed     SENSATION: Not tested  LOWER EXTREMITY ROM:  Active ROM Right eval Left eval  Hip flexion 5 5  Hip extension 3 3  Hip abduction 5 5  Hip adduction    Hip internal rotation    Hip external rotation    Knee flexion 4 4  Knee extension 5 5  Ankle dorsiflexion 4 4+  Ankle plantarflexion    Ankle inversion    Ankle eversion     (Blank rows = not tested)  LOWER EXTREMITY MMT: wfl    FUNCTIONAL TESTS:  30 seconds chair stand test: 9 2 minute walk test: 36 Single leg stance:  Rt: 15", Lt:  11"     Comments: hips adduct with gait    TODAY'S TREATMENT:                                                                                                                              DATE:  09/21/22 Prone:  hip extension 10x (cueing for proper form) Heel squeeze 10x 5" Supine: Bridge x 10  Sit to stand x 10 cueing for mechanics eccentric control Squat: 3 sets x 5 reps cueing for mechanics, improved with mirror feedback Rockerboard x 2 min lateral then DF/PF Lateral step up 4in 10x each Knee drive step up 6in 38G  53/64/68  Evaluation Prone single leg raise x 10 Prone heel squeeze x 10 Bridge x 10  Sit to stand x 10  PATIENT EDUCATION:  Education details: HEP Person educated: Patient Education method:  Explanation Education comprehension: verbalized understanding  HOME EXERCISE PROGRAM: Access Code: [email or web address] URL: [email or web address] Date: 09/13/2022 Prepared by: Virgina Organ  Exercises - Prone Hip Extension  - 2 x daily - 7 x weekly - 1 sets - 10 reps - 3-5" hold - Prone Heel Squeeze  - 2 x daily - 7 x weekly - 1 sets - 10 reps - 3-5" hold - Supine Bridge  - 2 x daily - 7 x weekly - 1 sets - 10 reps - 3-5 hold - Sit to Stand  - 2 x daily - 7 x weekly - 1 sets - 10 reps - Mini Squat  - 2 x daily - 7 x weekly - 1 sets - 10 reps - 3-5 hold  ASSESSMENT:  CLINICAL IMPRESSION: Reviewed goals, educated importance of HEP compliance, pt reports compliance though has some question with proper form with current HEP.  Reviewed proper form with all exercises with cueing for control and form, pt tendency to rush through exercises, educated benefits of controlled movements.  Pt stated she was able to find compression tights at [employer], questions if socks were available.  Educated and measurements complete for ETI compression socks, encouraged to bring in if difficult to donn.  OBJECTIVE IMPAIRMENTS: decreased activity tolerance, decreased balance, difficulty walking, decreased strength, and pain.   ACTIVITY LIMITATIONS: carrying, lifting, squatting, stairs, and locomotion level  PARTICIPATION LIMITATIONS: shopping and community activity  PERSONAL FACTORS: Time since onset of injury/illness/exacerbation are also affecting patient's functional outcome.   REHAB POTENTIAL: Fair    CLINICAL DECISION MAKING: Stable/uncomplicated  EVALUATION COMPLEXITY: Low   GOALS: Goals reviewed with patient? No  SHORT TERM GOALS: Target date: 09/27/22 Pt to be I in HEP in order to decrease her pain to no greater than a 5/10 throughout the day to improve functional mobility Baseline: Goal status: IN PROGRESS  2.  Pt strength in LE to increase 1/2 grade to allow pt to rise from a low couch  with greater ease.  Baseline:  Goal status: IN PROGRESS  3.  Pt to be able to  be on her feet for an hour without increased knee/leg pain in order to clean the house.  Baseline:  Goal status: IN PROGRESS    LONG TERM GOALS: Target date: 10/11/22  Pt to be I in HEP in order to decrease her pain to no greater than a 3/10 throughout the day to improve functional mobility Baseline:  Goal status: IN PROGRESS  2.   Pt strength in LE to increase 1 grade to allow pt to rise from a squatted position with greater ease. Baseline:  Goal status: IN PROGRESS  3.  Pt to be able to be on her feet for two hours without  increased knee/leg pain for community activity.  Baseline:  Goal status: IN PROGRESS  4.  Pt to be able to single leg stance for 30 seconds B for improved balance Baseline:  Goal status: IN PROGRESS     PLAN:  PT FREQUENCY: 2x/week  PT DURATION: 4 weeks  PLANNED INTERVENTIONS: Therapeutic exercises, Therapeutic activity, Neuromuscular re-education, Balance training, Gait training, Patient/Family education, Self Care, and Manual therapy  PLAN FOR NEXT SESSION: Continue step ups, lunging, stretching; progress stability as able.  Add quadruped exercises as well.    Becky Sax, LPTA/CLT; Rowe Clack [phone]  09/21/2022

## 2022-09-29 ENCOUNTER — Encounter (HOSPITAL_COMMUNITY): Payer: Self-pay

## 2022-09-29 ENCOUNTER — Ambulatory Visit (HOSPITAL_COMMUNITY): Payer: BC Managed Care – PPO | Attending: Family Medicine

## 2022-09-29 DIAGNOSIS — M25561 Pain in right knee: Secondary | ICD-10-CM | POA: Insufficient documentation

## 2022-09-29 DIAGNOSIS — M6281 Muscle weakness (generalized): Secondary | ICD-10-CM | POA: Insufficient documentation

## 2022-09-29 DIAGNOSIS — G8929 Other chronic pain: Secondary | ICD-10-CM | POA: Insufficient documentation

## 2022-09-29 DIAGNOSIS — M25562 Pain in left knee: Secondary | ICD-10-CM | POA: Insufficient documentation

## 2022-09-29 NOTE — Therapy (Signed)
OUTPATIENT PHYSICAL THERAPY LOWER EXTREMITY TREATMENT   Patient Name: Brooke Paul MRN: [id number] DOB:1996/05/16, 27 y.o., female Today's Date: 09/29/2022  END OF SESSION:   PT End of Session - 09/29/22 1358     Visit Number 3    Number of Visits 8    Date for PT Re-Evaluation 10/13/22    Authorization Type [employer] state health plane    Progress Note Due on Visit 8    PT Start Time 1032    PT Stop Time 1112    PT Time Calculation (min) 40 min    Activity Tolerance Patient tolerated treatment well    Behavior During Therapy [facility] for tasks assessed/performed               History reviewed. No pertinent past medical history. Past Surgical History:  Procedure Laterality Date   STRABISMUS SURGERY     Patient Active Problem List   Diagnosis Date Noted   Pain in both lower legs 07/08/2022   Obesity (BMI 30-39.9) 07/08/2022   Preventative health care 07/08/2022    PCP: Thersa Salt  REFERRING PROVIDER: Mordecai Rasmussen, MD  REFERRING DIAG: [phone] (ICD-10-CM) - Pain in both lower legs  THERAPY DIAG:  Pain in right leg Pain in left leg Muscle weakness   Rationale for Evaluation and Treatment: Rehabilitation  ONSET DATE: 07/08/22  SUBJECTIVE:   SUBJECTIVE STATEMENT: 09/29/22:  Reports she was sore for a day and a half following last session.  No reports of pain today since Sunday.  Reports compliance with HEP daily, has some question with mechanics on squats.  Has been trying to slow down exercises and reports increased comfort wearing compression tights at night.    Eval Subjective:  Pt states that she has had pain in her legs since she was little but it got worse in high school.  The pain subsided and now it has returned for the past 2 years.  Her RT  leg is a lot worse than the left.  The pain is from her knee down to her ankle.    PERTINENT HISTORY: N/A PAIN:  Are you having pain? Yes: 09/21/22:  0/10.  NPRS scale: 5/10, worst 7/10, best 0 Pain  location: whole LE  Pain description: stabbing pain  Aggravating factors: physical activity, weather  Relieving factors: heat and ice   PRECAUTIONS: None  WEIGHT BEARING RESTRICTIONS: No  FALLS:  Has patient fallen in last 6 months? no  LIVING ENVIRONMENT: Lives with: lives with their family Lives in: House/apartment Stairs: Yes: External: 2 steps; on right going up Has following equipment at home: None  OCCUPATION: unemployed  PLOF: Independent  PATIENT GOALS: To have less pain in her legs   NEXT MD VISIT: next year   OBJECTIVE:   DIAGNOSTIC FINDINGS: IMPRESSION: Negative right knee radiographs.  IMPRESSION: Negative left knee radiographs.     Electronically Signed   By: Maurine Simmering M.D.   On: 07/14/2022 08:06   PATIENT SURVEYS:  FOTO 59  COGNITION: Overall cognitive status: Within functional limits for tasks assessed     SENSATION: Not tested  LOWER EXTREMITY ROM:  Active ROM Right eval Left eval  Hip flexion 5 5  Hip extension 3 3  Hip abduction 5 5  Hip adduction    Hip internal rotation    Hip external rotation    Knee flexion 4 4  Knee extension 5 5  Ankle dorsiflexion 4 4+  Ankle plantarflexion    Ankle inversion  Ankle eversion     (Blank rows = not tested)  LOWER EXTREMITY MMT: wfl    FUNCTIONAL TESTS:  30 seconds chair stand test: 9 2 minute walk test: 36 Single leg stance:  Rt: 15", Lt:  11"     Comments: hips adduct with gait    TODAY'S TREATMENT:                                                                                                                              DATE:  09/29/22 Squat 10x cueing for mechanics front of chair; 2nd set improved with mirror feedback Prone: UE/LE extension 10x 3" Supine: Bridge 10x 5" STS eccentric control 10x Lateral step up 4in 15x Reciprocal pattern 7in 1HR 5RT Lunges on 6in step 15x Marching 10x each alternating intermittent HHA Hip Abduction 15x 3" 1 HHA Hip extension 15x  3" 1 HHA, cueing for posture and abdominal  SLS Lt 10", Rt 10" Sidestep RTB around thigh 2RT  09/21/22 Prone:  hip extension 10x (cueing for proper form) Heel squeeze 10x 5" Supine: Bridge x 10  Sit to stand x 10 cueing for mechanics eccentric control Squat: 3 sets x 5 reps cueing for mechanics, improved with mirror feedback Rockerboard x 2 min lateral then DF/PF Lateral step up 4in 10x each Knee drive step up 6in 56O  13/08/65  Evaluation Prone single leg raise x 10 Prone heel squeeze x 10 Bridge x 10  Sit to stand x 10  PATIENT EDUCATION:  Education details: HEP Person educated: Patient Education method: Explanation Education comprehension: verbalized understanding  HOME EXERCISE PROGRAM: Access Code: [email or web address] URL: [email or web address] Date: 09/13/2022 Prepared by: Virgina Organ  Exercises - Prone Hip Extension  - 2 x daily - 7 x weekly - 1 sets - 10 reps - 3-5" hold - Prone Heel Squeeze  - 2 x daily - 7 x weekly - 1 sets - 10 reps - 3-5" hold - Supine Bridge  - 2 x daily - 7 x weekly - 1 sets - 10 reps - 3-5 hold - Sit to Stand  - 2 x daily - 7 x weekly - 1 sets - 10 reps - Mini Squat  - 2 x daily - 7 x weekly - 1 sets - 10 reps - 3-5 hold  ASSESSMENT:  CLINICAL IMPRESSION: Pt arrived wearing compression tights, reviewed when to wear and encouraged to remove at night though pt reports tights assist with leg pain at night.  Session focus with core and proximal strengthening.  Reviewed mechanics with functional squats with vast improvements noted, good control.  Did encourage pt to complete in front of mirror for visual feedback.  Progressed to standing exercises for functional strengthening with good form following initial demonstration.  No reports of pain.  Pt ambulates with NBOS and IR, decreased SLS and presents with weak gluteal and abdominal strength.  OBJECTIVE IMPAIRMENTS: decreased activity tolerance, decreased balance, difficulty walking,  decreased strength, and pain.  ACTIVITY LIMITATIONS: carrying, lifting, squatting, stairs, and locomotion level  PARTICIPATION LIMITATIONS: shopping and community activity  PERSONAL FACTORS: Time since onset of injury/illness/exacerbation are also affecting patient's functional outcome.   REHAB POTENTIAL: Fair    CLINICAL DECISION MAKING: Stable/uncomplicated  EVALUATION COMPLEXITY: Low   GOALS: Goals reviewed with patient? No  SHORT TERM GOALS: Target date: 09/27/22 Pt to be I in HEP in order to decrease her pain to no greater than a 5/10 throughout the day to improve functional mobility Baseline: Goal status: IN PROGRESS  2.  Pt strength in LE to increase 1/2 grade to allow pt to rise from a low couch with greater ease.  Baseline:  Goal status: IN PROGRESS  3.  Pt to be able to be on her feet for an hour without increased knee/leg pain in order to clean the house.  Baseline:  Goal status: IN PROGRESS    LONG TERM GOALS: Target date: 10/11/22  Pt to be I in HEP in order to decrease her pain to no greater than a 3/10 throughout the day to improve functional mobility Baseline:  Goal status: IN PROGRESS  2.   Pt strength in LE to increase 1 grade to allow pt to rise from a squatted position with greater ease. Baseline:  Goal status: IN PROGRESS  3.  Pt to be able to be on her feet for two hours without  increased knee/leg pain for community activity.  Baseline:  Goal status: IN PROGRESS  4.  Pt to be able to single leg stance for 30 seconds B for improved balance Baseline:  Goal status: IN PROGRESS     PLAN:  PT FREQUENCY: 2x/week  PT DURATION: 4 weeks  PLANNED INTERVENTIONS: Therapeutic exercises, Therapeutic activity, Neuromuscular re-education, Balance training, Gait training, Patient/Family education, Self Care, and Manual therapy  PLAN FOR NEXT SESSION: Continue stretching, SLS, core based exercises, 3D hip excursion, lunges on 4in step height and reduce  height PRN, vector stance, postural strengthening.    Ihor Austin, LPTA/CLT; Delana Meyer [phone]  09/29/2022

## 2022-10-04 ENCOUNTER — Ambulatory Visit (HOSPITAL_COMMUNITY): Payer: BC Managed Care – PPO | Admitting: Physical Therapy

## 2022-10-04 DIAGNOSIS — M6281 Muscle weakness (generalized): Secondary | ICD-10-CM

## 2022-10-04 DIAGNOSIS — G8929 Other chronic pain: Secondary | ICD-10-CM

## 2022-10-04 DIAGNOSIS — M25561 Pain in right knee: Secondary | ICD-10-CM | POA: Diagnosis not present

## 2022-10-04 NOTE — Therapy (Signed)
OUTPATIENT PHYSICAL THERAPY LOWER EXTREMITY TREATMENT   Patient Name: Brooke Paul MRN: [id number] DOB:1995-09-28, 27 y.o., female Today's Date: 10/04/2022  END OF SESSION:   PT End of Session - 10/04/22 0935     Visit Number 4    Number of Visits 8    Date for PT Re-Evaluation 10/13/22    Authorization Type [employer] state health plane    Progress Note Due on Visit 8    PT Start Time [phone]    PT Stop Time 1024    PT Time Calculation (min) 46 min    Activity Tolerance Patient tolerated treatment well    Behavior During Therapy [facility] for tasks assessed/performed               No past medical history on file. Past Surgical History:  Procedure Laterality Date   STRABISMUS SURGERY     Patient Active Problem List   Diagnosis Date Noted   Pain in both lower legs 07/08/2022   Obesity (BMI 30-39.9) 07/08/2022   Preventative health care 07/08/2022    PCP: Thersa Salt  REFERRING PROVIDER: Mordecai Rasmussen, MD  REFERRING DIAG: [phone] (ICD-10-CM) - Pain in both lower legs  THERAPY DIAG:  Pain in right leg Pain in left leg Muscle weakness   Rationale for Evaluation and Treatment: Rehabilitation  ONSET DATE: 07/08/22  SUBJECTIVE:   SUBJECTIVE STATEMENT: Pt reports no pain or issues in her legs today.  Reports compliance with HEP.  Eval Subjective:  Pt states that she has had pain in her legs since she was little but it got worse in high school.  The pain subsided and now it has returned for the past 2 years.  Her RT  leg is a lot worse than the left.  The pain is from her knee down to her ankle.    PERTINENT HISTORY: N/A PAIN:  Are you having pain? Yes: 09/21/22:  0/10.  NPRS scale: 5/10, worst 7/10, best 0 Pain location: whole LE  Pain description: stabbing pain  Aggravating factors: physical activity, weather  Relieving factors: heat and ice   PRECAUTIONS: None  WEIGHT BEARING RESTRICTIONS: No  FALLS:  Has patient fallen in last 6 months?  no  LIVING ENVIRONMENT: Lives with: lives with their family Lives in: House/apartment Stairs: Yes: External: 2 steps; on right going up Has following equipment at home: None  OCCUPATION: unemployed  PLOF: Independent  PATIENT GOALS: To have less pain in her legs   NEXT MD VISIT: next year   OBJECTIVE:   DIAGNOSTIC FINDINGS: IMPRESSION: Negative right knee radiographs.  IMPRESSION: Negative left knee radiographs.     Electronically Signed   By: Maurine Simmering M.D.   On: 07/14/2022 08:06   PATIENT SURVEYS:  FOTO 59  COGNITION: Overall cognitive status: Within functional limits for tasks assessed     SENSATION: Not tested  LOWER EXTREMITY ROM:  Active ROM Right eval Left eval  Hip flexion 5 5  Hip extension 3 3  Hip abduction 5 5  Hip adduction    Hip internal rotation    Hip external rotation    Knee flexion 4 4  Knee extension 5 5  Ankle dorsiflexion 4 4+  Ankle plantarflexion    Ankle inversion    Ankle eversion     (Blank rows = not tested)  LOWER EXTREMITY MMT: wfl    FUNCTIONAL TESTS:  30 seconds chair stand test: 9 2 minute walk test: 36 Single leg stance:  Rt: 15", Lt:  11"     Comments: hips adduct with gait    TODAY'S TREATMENT:                                                                                                                              DATE:  10/04/22 Standing:  Squats 10X in front of chair 10X (unable to get correct form standing at bars)  4" lateral step up 10X each with UE assist  4" forward step up with opposite knee drive 83M with UE assist  4" lunges 15X each without UE assist  Lunges onto 4" no UE assist 10X each LE  Alternating march with intermittent HHA high holds  Hip abduction 15X 1 HHA  Hip extension 15X 1 HHA  Vectors 2 sets of 5 each LE with toe in neutral, 1 HHA 5" holds each  SLS trial of 5 attempts each with toes into neutral to increase challenge (tends to toe in)  Reciprocal pattern 7in 1HR  5RT  Hamstring strech on 12" step 2X20" each Seated:  leg press 5Pl 2X10  Long sitting hamstring stretch also shown for home use   09/29/22 Squat 10x cueing for mechanics front of chair; 2nd set improved with mirror feedback Prone: UE/LE extension 10x 3" Supine: Bridge 10x 5" STS eccentric control 10x Lateral step up 4in 15x Reciprocal pattern 7in 1HR 5RT Lunges on 6in step 15x Marching 10x each alternating intermittent HHA Hip Abduction 15x 3" 1 HHA Hip extension 15x 3" 1 HHA, cueing for posture and abdominal  SLS Lt 10", Rt 10" Sidestep RTB around thigh 2RT  09/21/22 Prone:  hip extension 10x (cueing for proper form) Heel squeeze 10x 5" Supine: Bridge x 10  Sit to stand x 10 cueing for mechanics eccentric control Squat: 3 sets x 5 reps cueing for mechanics, improved with mirror feedback Rockerboard x 2 min lateral then DF/PF Lateral step up 4in 10x each Knee drive step up 6in 19Q  22/29/79  Evaluation Prone single leg raise x 10 Prone heel squeeze x 10 Bridge x 10  Sit to stand x 10  PATIENT EDUCATION:  Education details: HEP Person educated: Patient Education method: Explanation Education comprehension: verbalized understanding  HOME EXERCISE PROGRAM: Access Code: [email or web address] URL: [email or web address] Date: 09/13/2022 Prepared by: Virgina Organ  Exercises - Prone Hip Extension  - 2 x daily - 7 x weekly - 1 sets - 10 reps - 3-5" hold - Prone Heel Squeeze  - 2 x daily - 7 x weekly - 1 sets - 10 reps - 3-5" hold - Supine Bridge  - 2 x daily - 7 x weekly - 1 sets - 10 reps - 3-5 hold - Sit to Stand  - 2 x daily - 7 x weekly - 1 sets - 10 reps - Mini Squat  - 2 x daily - 7 x weekly - 1 sets - 10 reps - 3-5 hold  ASSESSMENT:  CLINICAL IMPRESSION: Continued with LE strength/stability  focus.  Pt with difficulty completing squats in correct form having to use the chair behind to get weight through heels rather than toes. PT tends to toe in with  mechanics requiring cues to keep toe in neutral for increased challenge and targeting weakness.  Added standing hamstring stretch with noted tightness bilaterally and shown how to complete in long sitting as well.  Pt required several rest breaks during session due to fatigue.  No reports of pain during or at completion of session.  Pt ambulates with NBOS and IR, decreased SLS and presents with weak gluteal and abdominal strength.  OBJECTIVE IMPAIRMENTS: decreased activity tolerance, decreased balance, difficulty walking, decreased strength, and pain.   ACTIVITY LIMITATIONS: carrying, lifting, squatting, stairs, and locomotion level  PARTICIPATION LIMITATIONS: shopping and community activity  PERSONAL FACTORS: Time since onset of injury/illness/exacerbation are also affecting patient's functional outcome.   REHAB POTENTIAL: Fair    CLINICAL DECISION MAKING: Stable/uncomplicated  EVALUATION COMPLEXITY: Low   GOALS: Goals reviewed with patient? No  SHORT TERM GOALS: Target date: 09/27/22 Pt to be I in HEP in order to decrease her pain to no greater than a 5/10 throughout the day to improve functional mobility Baseline: Goal status: IN PROGRESS  2.  Pt strength in LE to increase 1/2 grade to allow pt to rise from a low couch with greater ease.  Baseline:  Goal status: IN PROGRESS  3.  Pt to be able to be on her feet for an hour without increased knee/leg pain in order to clean the house.  Baseline:  Goal status: IN PROGRESS    LONG TERM GOALS: Target date: 10/11/22  Pt to be I in HEP in order to decrease her pain to no greater than a 3/10 throughout the day to improve functional mobility Baseline:  Goal status: IN PROGRESS  2.   Pt strength in LE to increase 1 grade to allow pt to rise from a squatted position with greater ease. Baseline:  Goal status: IN PROGRESS  3.  Pt to be able to be on her feet for two hours without  increased knee/leg pain for community activity.   Baseline:  Goal status: IN PROGRESS  4.  Pt to be able to single leg stance for 30 seconds B for improved balance Baseline:  Goal status: IN PROGRESS     PLAN:  PT FREQUENCY: 2x/week  PT DURATION: 4 weeks  PLANNED INTERVENTIONS: Therapeutic exercises, Therapeutic activity, Neuromuscular re-education, Balance training, Gait training, Patient/Family education, Self Care, and Manual therapy  PLAN FOR NEXT SESSION: Continue focus on hamstring stretching as she is extremely tight. Update HEP next session to include standing and hamstring stretching.   Lurena Nida, PTA/CLT [facility] Ph: [phone]  10/04/2022

## 2022-10-07 ENCOUNTER — Ambulatory Visit (HOSPITAL_COMMUNITY): Payer: BC Managed Care – PPO | Admitting: Physical Therapy

## 2022-10-07 DIAGNOSIS — M25561 Pain in right knee: Secondary | ICD-10-CM | POA: Diagnosis not present

## 2022-10-07 DIAGNOSIS — M6281 Muscle weakness (generalized): Secondary | ICD-10-CM

## 2022-10-07 DIAGNOSIS — G8929 Other chronic pain: Secondary | ICD-10-CM

## 2022-10-07 NOTE — Therapy (Signed)
OUTPATIENT PHYSICAL THERAPY LOWER EXTREMITY TREATMENT   Patient Name: Brooke Paul MRN: [id number] DOB:08-31-1996, 27 y.o., female Today's Date: 10/07/2022  END OF SESSION:   PT End of Session - 10/07/22 0855     Visit Number 5    Number of Visits 8    Date for PT Re-Evaluation 10/13/22    Authorization Type BCBS state health plane    Progress Note Due on Visit 8    PT Start Time 0815    PT Stop Time 0900    PT Time Calculation (min) 45 min    Activity Tolerance Patient tolerated treatment well    Behavior During Therapy [facility] for tasks assessed/performed               No past medical history on file. Past Surgical History:  Procedure Laterality Date   STRABISMUS SURGERY     Patient Active Problem List   Diagnosis Date Noted   Pain in both lower legs 07/08/2022   Obesity (BMI 30-39.9) 07/08/2022   Preventative health care 07/08/2022    PCP: Thersa Salt  REFERRING PROVIDER: Mordecai Rasmussen, MD  REFERRING DIAG: [phone] (ICD-10-CM) - Pain in both lower legs  THERAPY DIAG:  Pain in right leg Pain in left leg Muscle weakness   Rationale for Evaluation and Treatment: Rehabilitation  ONSET DATE: 07/08/22  SUBJECTIVE:   SUBJECTIVE STATEMENT: Pt states that she has no questions on her HEP.  She is getting the exercises done 1-2 x a day.     PERTINENT HISTORY: N/A PAIN:  Are you having pain? Yes: 5 09/21/22:  0/10.  NPRS scale: 5/10, worst 7/10, best 0 Pain location: whole LE  Pain description: stabbing pain  Aggravating factors: physical activity, weather  Relieving factors: heat and ice   PRECAUTIONS: None  WEIGHT BEARING RESTRICTIONS: No  FALLS:  Has patient fallen in last 6 months? no  LIVING ENVIRONMENT: Lives with: lives with their family Lives in: House/apartment Stairs: Yes: External: 2 steps; on right going up Has following equipment at home: None  OCCUPATION: unemployed  PLOF: Independent  PATIENT GOALS: To have less  pain in her legs   NEXT MD VISIT: next year   OBJECTIVE:   DIAGNOSTIC FINDINGS: IMPRESSION: Negative right knee radiographs.  IMPRESSION: Negative left knee radiographs.     Electronically Signed   By: Maurine Simmering M.D.   On: 07/14/2022 08:06   PATIENT SURVEYS:  FOTO 59  COGNITION: Overall cognitive status: Within functional limits for tasks assessed     SENSATION: Not tested  LOWER EXTREMITY ROM:  Active ROM Right eval Left eval  Hip flexion 5 5  Hip extension 3 3  Hip abduction 5 5  Hip adduction    Hip internal rotation    Hip external rotation    Knee flexion 4 4  Knee extension 5 5  Ankle dorsiflexion 4 4+  Ankle plantarflexion    Ankle inversion    Ankle eversion     (Blank rows = not tested)  LOWER EXTREMITY MMT: wfl    FUNCTIONAL TESTS:  30 seconds chair stand test: 9 2 minute walk test: 36 Single leg stance:  Rt: 15", Lt:  11"     Comments: hips adduct with gait    TODAY'S TREATMENT:  DATE:  10/07/22: warm up nustep level 3 hills 3 x 6 minutes  Standing: Rocker board x 2 min Heel raise x 10 Toe raise x 10 Terminal knee extension x 10 B  Squat x 10  Side step x 10 Vector stance 5" holds x 3  Sit to stand x 15  6" step up B x 10  10/04/22 Standing:  Squats 10X in front of chair 10X (unable to get correct form standing at bars)  4" lateral step up 10X each with UE assist  4" forward step up with opposite knee drive 17P with UE assist  4" lunges 15X each without UE assist  Lunges onto 4" no UE assist 10X each LE  Alternating march with intermittent HHA high holds  Hip abduction 15X 1 HHA  Hip extension 15X 1 HHA  Vectors 2 sets of 5 each LE with toe in neutral, 1 HHA 5" holds each  SLS trial of 5 attempts each with toes into neutral to increase challenge (tends to toe in)  Reciprocal pattern 7in 1HR 5RT  Hamstring  strech on 12" step 2X20" each Seated:  leg press 5Pl 2X10  Long sitting hamstring stretch also shown for home use   PATIENT EDUCATION:  Education details: HEP Person educated: Patient Education method: Explanation Education comprehension: verbalized understanding  HOME EXERCISE PROGRAM:            10/07/22            Access Code: [email or web address] URL: [email or web address] Date: 10/07/2022 Prepared by: Virgina Organ  Exercises - Heel Raises with Counter Support  - 2 x daily - 7 x weekly - 1 sets - 10 reps - 3-5" hold - Toe Raises with Counter Support  - 2 x daily - 7 x weekly - 1 sets - 10 reps - 3-5" hold - Mini Squat with Counter Support  - 2 x daily - 7 x weekly - 1 sets - 10 reps - 3-5" hold - Sidestepping  - 2 x daily - 7 x weekly - 1 sets - 10 reps Access Code: [email or web address] URL: [email or web address] Date: 09/13/2022 Prepared by: Virgina Organ  Exercises - Prone Hip Extension  - 2 x daily - 7 x weekly - 1 sets - 10 reps - 3-5" hold - Prone Heel Squeeze  - 2 x daily - 7 x weekly - 1 sets - 10 reps - 3-5" hold - Supine Bridge  - 2 x daily - 7 x weekly - 1 sets - 10 reps - 3-5 hold - Sit to Stand  - 2 x daily - 7 x weekly - 1 sets - 10 reps - Mini Squat  - 2 x daily - 7 x weekly - 1 sets - 10 reps - 3-5 hold  ASSESSMENT:  CLINICAL IMPRESSION: Began session with aerobic activity for warm up.  Added new standing exercises which were difficult for pt to master.  Pt inverts with dorsiflexion on Rt and needs visual and verbal cuing to correct.  Worked with pt on heel toe gait as pt tends to walk with knees slightly bent.  Updated HEP.    No reports of pain during or at completion of session.  Pt ambulates with NBOS and IR, decreased SLS and presents with weak gluteal and abdominal strength.  OBJECTIVE IMPAIRMENTS: decreased activity tolerance, decreased balance, difficulty walking, decreased strength, and pain.   ACTIVITY LIMITATIONS: carrying, lifting,  squatting, stairs, and locomotion level  PARTICIPATION LIMITATIONS: shopping and community activity  PERSONAL FACTORS: Time  since onset of injury/illness/exacerbation are also affecting patient's functional outcome.   REHAB POTENTIAL: Fair    CLINICAL DECISION MAKING: Stable/uncomplicated  EVALUATION COMPLEXITY: Low   GOALS: Goals reviewed with patient? No  SHORT TERM GOALS: Target date: 09/27/22 Pt to be I in HEP in order to decrease her pain to no greater than a 5/10 throughout the day to improve functional mobility Baseline: Goal status: IN PROGRESS  2.  Pt strength in LE to increase 1/2 grade to allow pt to rise from a low couch with greater ease.  Baseline:  Goal status: IN PROGRESS  3.  Pt to be able to be on her feet for an hour without increased knee/leg pain in order to clean the house.  Baseline:  Goal status: IN PROGRESS    LONG TERM GOALS: Target date: 10/11/22  Pt to be I in HEP in order to decrease her pain to no greater than a 3/10 throughout the day to improve functional mobility Baseline:  Goal status: IN PROGRESS  2.   Pt strength in LE to increase 1 grade to allow pt to rise from a squatted position with greater ease. Baseline:  Goal status: IN PROGRESS  3.  Pt to be able to be on her feet for two hours without  increased knee/leg pain for community activity.  Baseline:  Goal status: IN PROGRESS  4.  Pt to be able to single leg stance for 30 seconds B for improved balance Baseline:  Goal status: IN PROGRESS     PLAN:  PT FREQUENCY: 2x/week  PT DURATION: 4 weeks  PLANNED INTERVENTIONS: Therapeutic exercises, Therapeutic activity, Neuromuscular re-education, Balance training, Gait training, Patient/Family education, Self Care, and Manual therapy  PLAN FOR NEXT SESSION: Continue to work on gait and  focus on hamstring stretching as she is extremely tight. Update HEP next session to include standing and hamstring stretching.  Rayetta Humphrey,  [place] CLT [phone]  10/07/22 900

## 2022-10-11 ENCOUNTER — Encounter (HOSPITAL_COMMUNITY): Payer: BC Managed Care – PPO | Admitting: Physical Therapy

## 2022-10-13 ENCOUNTER — Ambulatory Visit (HOSPITAL_COMMUNITY): Payer: BC Managed Care – PPO | Admitting: Physical Therapy

## 2022-10-13 DIAGNOSIS — M6281 Muscle weakness (generalized): Secondary | ICD-10-CM

## 2022-10-13 DIAGNOSIS — G8929 Other chronic pain: Secondary | ICD-10-CM

## 2022-10-13 DIAGNOSIS — M25561 Pain in right knee: Secondary | ICD-10-CM | POA: Diagnosis not present

## 2022-10-13 NOTE — Therapy (Signed)
OUTPATIENT PHYSICAL THERAPY LOWER EXTREMITY TREATMENT   Patient Name: Brooke Paul MRN: [id number] DOB:11/04/1995, 27 y.o., female Today's Date: 10/13/2022 PHYSICAL THERAPY DISCHARGE SUMMARY  Visits from Start of Care: 6  Current functional level related to goals / functional outcomes: All short term and 3/4 long term goals met    Remaining deficits: Has not been on her feet x 2 hr but due to not having to not that she can not.   Education / Equipment: HEP, the importance of improving her sit to stand.    Patient agrees to discharge. Patient goals were met. Patient is being discharged due to being pleased with the current functional level.  END OF SESSION:   PT End of Session - 10/13/22 1120     Visit Number 6    Number of Visits 8    Date for PT Re-Evaluation 10/13/22    Authorization Type BCBS state health plane    Progress Note Due on Visit 8    PT Start Time 1120    PT Stop Time 1200    PT Time Calculation (min) 40 min    Activity Tolerance Patient tolerated treatment well    Behavior During Therapy WFL for tasks assessed/performed               No past medical history on file. Past Surgical History:  Procedure Laterality Date   STRABISMUS SURGERY     Patient Active Problem List   Diagnosis Date Noted   Pain in both lower legs 07/08/2022   Obesity (BMI 30-39.9) 07/08/2022   Preventative health care 07/08/2022    PCP: Thersa Salt  REFERRING PROVIDER: Mordecai Rasmussen, MD  REFERRING DIAG: [phone] (ICD-10-CM) - Pain in both lower legs  THERAPY DIAG:  Pain in right leg Pain in left leg Muscle weakness   Rationale for Evaluation and Treatment: Rehabilitation  ONSET DATE: 07/08/22  SUBJECTIVE:   SUBJECTIVE STATEMENT: Pt states that her pain is less intense and less frequent since starting therapy. She is not limited in any thing that she normally does. PERTINENT HISTORY: N/A PAIN:  Are you having pain? Yes: 0  09/21/22:  0/10.   NPRS scale: 0/10, worst 3/10, best 0 Pain location: whole LE  Pain description: stabbing pain  Aggravating factors: physical activity, weather  Relieving factors: heat and ice   PRECAUTIONS: None  WEIGHT BEARING RESTRICTIONS: No  FALLS:  Has patient fallen in last 6 months? no  LIVING ENVIRONMENT: Lives with: lives with their family Lives in: House/apartment Stairs: Yes: External: 2 steps; on right going up Has following equipment at home: None  OCCUPATION: unemployed  PLOF: Independent  PATIENT GOALS: To have less pain in her legs   NEXT MD VISIT: next year   OBJECTIVE:   DIAGNOSTIC FINDINGS: IMPRESSION: Negative right knee radiographs.  IMPRESSION: Negative left knee radiographs.     Electronically Signed   By: Maurine Simmering M.D.   On: 07/14/2022 08:06   PATIENT SURVEYS:  FOTO 59 1/18: 67% COGNITION: Overall cognitive status: Within functional limits for tasks assessed     SENSATION: Not tested  LOWER EXTREMITY ROM: wfl   Stength: Right eval Right 10/13/22 Left eval Left 10/13/2022  Hip flexion 5 5 5 5   Hip extension 3 4+ 3 4+  Hip abduction 5 5 5 5   Hip adduction      Hip internal rotation      Hip external rotation      Knee flexion 4 5 4  5  Knee extension 5 5 5 5   Ankle dorsiflexion 4 4+ 4+ 5  Ankle plantarflexion      Ankle inversion      Ankle eversion       (Blank rows = not tested)  LOWER EXTREMITY MMT: wfl    FUNCTIONAL TESTS:  30 seconds chair stand test: 9 30 second chair stand test: 1/18:  11;  for age below poor would be 22  2 minute walk test: 360 ft;  1/18:  468' Single leg stance:  Rt: 15", Lt:  11"  : RT: 35"", Lt: 33"   Comments: hips adduct with gait    TODAY'S TREATMENT:                                                                                                                              DATE:  10/13/22:  Reassessment:  Mm test Sit to stand x 10 Walking x 2 minutes  Single leg stance x 3 reps B  10/07/22:  warm up nustep level 3 hills 3 x 6 minutes  Standing: Rocker board x 2 min Heel raise x 10 Toe raise x 10 Terminal knee extension x 10 B  Squat x 10  Side step x 10 Vector stance 5" holds x 3  Sit to stand x 15  6" step up B x 10  10/04/22 Standing:  Squats 10X in front of chair 10X (unable to get correct form standing at bars)  4" lateral step up 10X each with UE assist  4" forward step up with opposite knee drive 12/03/22 with UE assist  4" lunges 15X each without UE assist  Lunges onto 4" no UE assist 10X each LE  Alternating march with intermittent HHA high holds  Hip abduction 15X 1 HHA  Hip extension 15X 1 HHA  Vectors 2 sets of 5 each LE with toe in neutral, 1 HHA 5" holds each  SLS trial of 5 attempts each with toes into neutral to increase challenge (tends to toe in)  Reciprocal pattern 7in 1HR 5RT  Hamstring strech on 12" step 2X20" each Seated:  leg press 5Pl 2X10  Long sitting hamstring stretch also shown for home use   PATIENT EDUCATION:  Education details: HEP Person educated: Patient Education method: Explanation Education comprehension: verbalized understanding  HOME EXERCISE PROGRAM:            10/07/22            Access Code: [email or web address] URL: [email or web address] Date: 10/07/2022 Prepared by: 12/06/2022  Exercises - Heel Raises with Counter Support  - 2 x daily - 7 x weekly - 1 sets - 10 reps - 3-5" hold - Toe Raises with Counter Support  - 2 x daily - 7 x weekly - 1 sets - 10 reps - 3-5" hold - Mini Squat with Counter Support  - 2 x daily - 7 x weekly - 1 sets - 10 reps - 3-5" hold -  Sidestepping  - 2 x daily - 7 x weekly - 1 sets - 10 reps Access Code: [email or web address] URL: [email or web address] Date: 09/13/2022 Prepared by: Rayetta Humphrey  Exercises - Prone Hip Extension  - 2 x daily - 7 x weekly - 1 sets - 10 reps - 3-5" hold - Prone Heel Squeeze  - 2 x daily - 7 x weekly - 1 sets - 10 reps - 3-5" hold - Supine Bridge  - 2 x  daily - 7 x weekly - 1 sets - 10 reps - 3-5 hold - Sit to Stand  - 2 x daily - 7 x weekly - 1 sets - 10 reps - Mini Squat  - 2 x daily - 7 x weekly - 1 sets - 10 reps - 3-5 hold  ASSESSMENT:  CLINICAL IMPRESSION:  PT has not been to therapy recently due to illness.  She feels much better since starting therapy and has been completing her HEP.  PT plans on joining a gym.  Both therapist and pt feel pt is ready for discharge at this time.   OBJECTIVE IMPAIRMENTS: decreased activity tolerance, decreased balance, difficulty walking, decreased strength, and pain.   ACTIVITY LIMITATIONS: carrying, lifting, squatting, stairs, and locomotion level  PARTICIPATION LIMITATIONS: shopping and community activity  PERSONAL FACTORS: Time since onset of injury/illness/exacerbation are also affecting patient's functional outcome.   REHAB POTENTIAL: Fair    CLINICAL DECISION MAKING: Stable/uncomplicated  EVALUATION COMPLEXITY: Low   GOALS: Goals reviewed with patient? No  SHORT TERM GOALS: Target date: 09/27/22 Pt to be I in HEP in order to decrease her pain to no greater than a 5/10 throughout the day to improve functional mobility Baseline: Goal status: MET  2.  Pt strength in LE to increase 1/2 grade to allow pt to rise from a low couch with greater ease.  Baseline:  Goal status: MET  3.  Pt to be able to be on her feet for an hour without increased knee/leg pain in order to clean the house.  Baseline:  Goal status: MET    LONG TERM GOALS: Target date: 10/11/22  Pt to be I in HEP in order to decrease her pain to no greater than a 3/10 throughout the day to improve functional mobility Baseline:  Goal status: MET  2.   Pt strength in LE to increase 1 grade to allow pt to rise from a squatted position with greater ease. Baseline:  Goal status: MET  3.  Pt to be able to be on her feet for two hours without  increased knee/leg pain for community activity.  Baseline:  Goal status: IN  PROGRESS Pt states she has almost been up for two hours but not quite.   4.  Pt to be able to single leg stance for 30 seconds B for improved balance Baseline:  Goal status: MET     PLAN:  PT FREQUENCY: 2x/week  PT DURATION: 4 weeks  PLANNED INTERVENTIONS: Therapeutic exercises, Therapeutic activity, Neuromuscular re-education, Balance training, Gait training, Patient/Family education, Self Care, and Manual therapy  PLAN FOR NEXT SESSION: Discharge pt to HEP, pt plans on joining the [place], [place] [phone]  11:45

## 2022-11-24 ENCOUNTER — Encounter: Payer: Self-pay | Admitting: Radiology

## 2024-01-12 ENCOUNTER — Ambulatory Visit: Payer: Self-pay | Admitting: Family Medicine

## 2024-01-12 NOTE — Telephone Encounter (Signed)
  Chief Complaint: chest pain couple of weeks. Worsening intensity Symptoms: chest pain right side and shoulder. Constant at night but minimal during the day . Hx anxiety. Family hx heart issues Frequency: couple of weeks and worsening now  Pertinent Negatives: Patient denies chest pain with difficulty breathing no dizziness, no sweating no nausea Disposition: [x] ED /[] Urgent Care (no appt availability in office) / [] Appointment(In office/virtual)/ []  [place] [facility]/ [] Home Care/ [] Refused Recommended Disposition /[] [place] [facility]/ []  Follow-up with PCP Additional Notes:   Recommended ED due to no appt today and worsening pain.  Please advise if PCP will see patient today .       Copied from CRM [id number]. Topic: Clinical - Red Word Triage >> Jan 12, 2024 11:02 AM Baldomero Bone wrote: Red Word that prompted transfer to Nurse Triage: chest pain for a couple of weeks. possible anxiety. callback number [phone] Reason for Disposition  [1] Chest pain lasts > 5 minutes AND [2] occurred > 3 days ago (72 hours) AND [3] NO chest pain or cardiac symptoms now  Answer Assessment - Initial Assessment Questions 1. LOCATION: "Where does it hurt?"       Chest area right shoulder 2. RADIATION: "Does the pain go anywhere else?" (e.g., into neck, jaw, arms, back)     No  3. ONSET: "When did the chest pain begin?" (Minutes, hours or days)      Couple of weeks  4. PATTERN: "Does the pain come and go, or has it been constant since it started?"  "Does it get worse with exertion?"      Constant at night ,minimal during the day  5. DURATION: "How long does it last" (e.g., seconds, minutes, hours)     Constant at night 6. SEVERITY: "How bad is the pain?"  (e.g., Scale 1-10; mild, moderate, or severe)    - MILD (1-3): doesn't interfere with normal activities     - MODERATE (4-7): interferes with normal activities or awakens from sleep    - SEVERE (8-10): excruciating pain, unable to do any  normal activities       Moderate difficulty with sleep  7. CARDIAC RISK FACTORS: "Do you have any history of heart problems or risk factors for heart disease?" (e.g., angina, prior heart attack; diabetes, high blood pressure, high cholesterol, smoker, or strong family history of heart disease)     Hx family  8. PULMONARY RISK FACTORS: "Do you have any history of lung disease?"  (e.g., blood clots in lung, asthma, emphysema, birth control pills)     na 9. CAUSE: "What do you think is causing the chest pain?"     Not sure  10. OTHER SYMPTOMS: "Do you have any other symptoms?" (e.g., dizziness, nausea, vomiting, sweating, fever, difficulty breathing, cough)       Chest pain right shoulder area worsening intensity  11. PREGNANCY: "Is there any chance you are pregnant?" "When was your last menstrual period?"       na  Protocols used: Chest Pain-A-AH

## 2024-01-19 ENCOUNTER — Encounter: Admitting: Family Medicine

## 2024-01-25 ENCOUNTER — Ambulatory Visit: Admitting: Family Medicine

## 2024-01-25 ENCOUNTER — Encounter: Payer: Self-pay | Admitting: Family Medicine

## 2024-01-25 VITALS — BP 118/78 | HR 78 | Wt 240.2 lb

## 2024-01-25 DIAGNOSIS — Z0000 Encounter for general adult medical examination without abnormal findings: Secondary | ICD-10-CM

## 2024-01-25 NOTE — Assessment & Plan Note (Signed)
 Doing well.  Healthy diet and regular exercise. Has upcoming appoint with OB/GYN for Pap smear. Advised to let me know or OB/GYN know if she becomes sexually active in regards to contraception and safe sexual practices. Follow-up annually.

## 2024-01-25 NOTE — Progress Notes (Signed)
 Subjective:  Patient ID: Brooke Paul, female    DOB: 1996/03/07  Age: 28 y.o. MRN: [id number]  CC:  Physical  HPI:  28 year old female presents for an annual physical exam.  Patient overall is doing well.  She recently had some chest pain which she believes was secondary to anxiety.  Has resolved since she switched employers.  Blood pressure is well-controlled.  Advised healthy diet and regular exercise.  She has an upcoming appointment with OB/GYN for cervical cancer screening.  She is not sexually active.  No alcohol.  No tobacco.  Patient Active Problem List   Diagnosis Date Noted   Annual physical exam 01/25/2024   Pain in both lower legs 07/08/2022   Obesity (BMI 30-39.9) 07/08/2022    Social Hx   Social History   Socioeconomic History   Marital status: Single    Spouse name: Not on file   Number of children: Not on file   Years of education: Not on file   Highest education level: Associate degree: occupational, Scientist, product/process development, or vocational program  Occupational History   Not on file  Tobacco Use   Smoking status: Never   Smokeless tobacco: Never  Substance and Sexual Activity   Alcohol use: Never   Drug use: Never   Sexual activity: Not on file  Other Topics Concern   Not on file  Social History Narrative   Not on file   Social Drivers of Health   Financial Resource Strain: Low Risk  (01/23/2024)   Overall Financial Resource Strain (CARDIA)    Difficulty of Paying Living Expenses: Not hard at all  Food Insecurity: No Food Insecurity (01/23/2024)   Hunger Vital Sign    Worried About Running Out of Food in the Last Year: Never true    Ran Out of Food in the Last Year: Never true  Transportation Needs: No Transportation Needs (01/23/2024)   PRAPARE - Administrator, Civil Service (Medical): No    Lack of Transportation (Non-Medical): No  Physical Activity: Unknown (01/23/2024)   Exercise Vital Sign    Days of Exercise per Week: Patient  declined    Minutes of Exercise per Session: Not on file  Stress: No Stress Concern Present (01/23/2024)   [employer] of Occupational Health - Occupational Stress Questionnaire    Feeling of Stress : Only a little  Social Connections: Unknown (01/23/2024)   Social Connection and Isolation Panel [NHANES]    Frequency of Communication with Friends and Family: Twice a week    Frequency of Social Gatherings with Friends and Family: Never    Attends Religious Services: More than 4 times per year    Active Member of [employer] or Organizations: Patient declined    Attends Banker Meetings: Not on file    Marital Status: Never married    Review of Systems  Constitutional: Negative.   Respiratory: Negative.      Objective:  BP 118/78   Pulse 78   Wt 240 lb 3.2 oz (109 kg)   SpO2 99%   BMI 40.59 kg/m      01/25/2024    1:17 PM 08/09/2022    8:50 AM 07/07/2022    2:16 PM  BP/Weight  Systolic BP 118 142 130  Diastolic BP 78 73 84  Wt. (Lbs) 240.2 228 230  BMI 40.59 kg/m2 38.53 kg/m2 38.87 kg/m2    Physical Exam Vitals and nursing note reviewed.  Constitutional:      General: She is not  in acute distress.    Appearance: Normal appearance. She is obese.  HENT:     Head: Normocephalic and atraumatic.     Right Ear: Tympanic membrane normal.     Left Ear: Tympanic membrane normal.     Mouth/Throat:     Pharynx: Oropharynx is clear.  Eyes:     General:        Right eye: No discharge.        Left eye: No discharge.     Conjunctiva/sclera: Conjunctivae normal.  Cardiovascular:     Rate and Rhythm: Normal rate and regular rhythm.  Pulmonary:     Effort: Pulmonary effort is normal.     Breath sounds: Normal breath sounds. No wheezing, rhonchi or rales.  Abdominal:     General: There is no distension.     Palpations: Abdomen is soft.     Tenderness: There is no abdominal tenderness.  Neurological:     Mental Status: She is alert.  Psychiatric:        Mood and  Affect: Mood normal.        Behavior: Behavior normal.     Lab Results  Component Value Date   WBC 9.2 07/07/2022   HGB 14.3 07/07/2022   HCT 44.0 07/07/2022   PLT 315 07/07/2022   GLUCOSE 80 07/07/2022   CHOL 199 07/07/2022   TRIG 146 07/07/2022   HDL 57 07/07/2022   LDLCALC 116 (H) 07/07/2022   ALT 16 07/07/2022   AST 18 07/07/2022   NA 139 07/07/2022   K 4.4 07/07/2022   CL 102 07/07/2022   CREATININE 0.73 07/07/2022   BUN 11 07/07/2022   CO2 23 07/07/2022   HGBA1C 4.8 07/07/2022     Assessment & Plan:  Annual physical exam Assessment & Plan: Doing well.  Healthy diet and regular exercise. Has upcoming appoint with OB/GYN for Pap smear. Advised to let me know or OB/GYN know if she becomes sexually active in regards to contraception and safe sexual practices. Follow-up annually.     Follow-up:  Return in about 1 year (around 01/24/2025).  Kathleen Papa DO [facility]

## 2024-02-23 ENCOUNTER — Encounter: Payer: Self-pay | Admitting: Obstetrics and Gynecology

## 2024-02-23 ENCOUNTER — Other Ambulatory Visit (HOSPITAL_COMMUNITY)
Admission: RE | Admit: 2024-02-23 | Discharge: 2024-02-23 | Disposition: A | Discharge status: Home / Self Care | Source: Ambulatory Visit | Attending: Obstetrics and Gynecology | Admitting: Obstetrics and Gynecology

## 2024-02-23 ENCOUNTER — Ambulatory Visit: Admitting: Obstetrics and Gynecology

## 2024-02-23 VITALS — BP 107/65 | HR 65 | Ht 64.5 in | Wt 237.2 lb

## 2024-02-23 DIAGNOSIS — Z30011 Encounter for initial prescription of contraceptive pills: Secondary | ICD-10-CM

## 2024-02-23 DIAGNOSIS — N946 Dysmenorrhea, unspecified: Secondary | ICD-10-CM

## 2024-02-23 DIAGNOSIS — Z3202 Encounter for pregnancy test, result negative: Secondary | ICD-10-CM

## 2024-02-23 DIAGNOSIS — Z01419 Encounter for gynecological examination (general) (routine) without abnormal findings: Secondary | ICD-10-CM | POA: Diagnosis present

## 2024-02-23 DIAGNOSIS — N920 Excessive and frequent menstruation with regular cycle: Secondary | ICD-10-CM

## 2024-02-23 LAB — POCT URINE PREGNANCY: Preg Test, Ur: NEGATIVE

## 2024-02-23 MED ORDER — ACETAMINOPHEN 500 MG PO TABS: 1000.0000 mg | ORAL_TABLET | Freq: Four times a day (QID) | ORAL | 0 refills | Status: AC | PRN

## 2024-02-23 MED ORDER — NORETHIN ACE-ETH ESTRAD-FE 1-20 MG-MCG PO TABS
1.0000 | ORAL_TABLET | Freq: Every day | ORAL | 11 refills | Status: AC
Start: 2024-02-23 — End: ?

## 2024-02-23 NOTE — Progress Notes (Signed)
 ANNUAL EXAM Patient name: Brooke Paul MRN [id number]  Date of birth: [birth year] Chief Complaint:   Gynecologic Exam  History of Present Illness:   Brooke Paul is a 28 y.o. G0P0000  female being seen today for a routine annual exam.  Current complaints: first pap smear today. Reports menorrhagia and dysmenorrhea with regular menstrual cycle.Has tried OTC treatment. Not sexually active  Established with PCP : Dr. Debrah Fan  Patient's last menstrual period was 01/29/2024.   The pregnancy intention screening data noted above was reviewed. Potential methods of contraception were discussed. The patient elected to proceed with No data recorded.   Last pap n/a. Results were: N/A. H/O abnormal pap: no Last mammogram: n/a. Results were: N/A. Family h/o breast cancer: no Last colonoscopy: n/a. Results were: N/A. Family h/o colorectal cancer: no     01/25/2024    1:11 PM 07/07/2022    2:18 PM  Depression screen PHQ 2/9  Decreased Interest 0 0  Down, Depressed, Hopeless 0 0  PHQ - 2 Score 0 0  Altered sleeping 1   Tired, decreased energy 1   Change in appetite 0   Feeling bad or failure about yourself  1   Trouble concentrating 0   Moving slowly or fidgety/restless 0   Suicidal thoughts 0   PHQ-9 Score 3   Difficult doing work/chores Not difficult at all         01/25/2024    1:12 PM  GAD 7 : Generalized Anxiety Score  Nervous, Anxious, on Edge 0  Control/stop worrying 1  Worry too much - different things 1  Trouble relaxing 0  Restless 0  Easily annoyed or irritable 1  Afraid - awful might happen 1  Total GAD 7 Score 4  Anxiety Difficulty Not difficult at all     Review of Systems:   Pertinent items are noted in HPI Denies any headaches, blurred vision, fatigue, shortness of breath, chest pain, abdominal pain, abnormal vaginal discharge/itching/odor/irritation, problems with periods, bowel movements, urination, or intercourse unless otherwise stated  above. Pertinent History Reviewed:  Reviewed past medical,surgical, social and family history.  Reviewed problem list, medications and allergies. Physical Assessment:   Vitals:   02/23/24 1030  BP: 107/65  Pulse: 65  Weight: 237 lb 3.2 oz (107.6 kg)  Height: 5' 4.5" (1.638 m)  Body mass index is 40.09 kg/m.        Physical Examination:   General appearance - well appearing, and in no distress  Mental status - alert, oriented   Psych:  She has a normal mood and affect  Skin - warm and dry, normal color  Chest - effort normal, all lung fields clear to auscultation bilaterally  Heart - normal rate and regular rhythm  Neck:  midline trachea  Breasts - breasts appear normal, no suspicious masses, no skin or nipple changes or  axillary nodes  Abdomen - soft, nontender, nondistended  Pelvic - difficult exam given pain, VULVA: normal appearing vulva with no masses, tenderness or lesions  VAGINA: normal appearing vagina with normal color and discharge, no lesions  CERVIX: normal appearing cervix without discharge or lesions, no CMT  Thin prep pap is done   UTERUS: uterus is felt to be normal size, shape, consistency and nontender   ADNEXA: No adnexal masses or tenderness noted.  Extremities:  No swelling or varicosities noted  Chaperone present for exam  No results found for this or any previous visit (from the past 24 hours).  Assessment &  Plan:  1. Encounter for gynecological examination with Papanicolaou smear of cervix (Primary) First pap discussed. Difficult pap given pain, discussed potential for inability to collect adequate cells on pap Encouraged self breast exams  - Cytology - PAP( [facility])  2. Encounter for initial prescription of contraceptive pills 3. Dysmenorrhea 4. Menorrhagia with regular cycle - POCT urine pregnancy - Reviewed different types of birth control available: OCPs, vaginal ring, transdermal patch, Nexplanon, DepoWe reviewed the advantages and risks  of each (particularly risk of VTE with estrogen containing options). We discussed side effects -LMP -sexually active: n/a -Discussed initiation with cycle, missed pill window  No current contraindications    Labs/procedures today:   Mammogram: @ 27yo, or sooner if problems Colonoscopy: @ 27yo, or sooner if problems  Orders Placed This Encounter  Procedures   POCT urine pregnancy    Meds:  Meds ordered this encounter  Medications   norethindrone-ethinyl estradiol-FE (JUNEL FE 1/20) 1-20 MG-MCG tablet    Sig: Take 1 tablet by mouth daily.    Dispense:  28 tablet    Refill:  11   acetaminophen  (TYLENOL ) 500 MG tablet    Sig: Take 2 tablets (1,000 mg total) by mouth every 6 (six) hours as needed.    Dispense:  30 tablet    Refill:  0    Follow-up: Return in about 3 months (around 05/25/2024), or birth control follow up.  Susi Eric, FNP

## 2024-02-25 ENCOUNTER — Encounter: Payer: Self-pay | Admitting: Obstetrics and Gynecology

## 2024-02-25 DIAGNOSIS — N920 Excessive and frequent menstruation with regular cycle: Secondary | ICD-10-CM | POA: Insufficient documentation

## 2024-02-25 DIAGNOSIS — N946 Dysmenorrhea, unspecified: Secondary | ICD-10-CM | POA: Insufficient documentation

## 2024-02-28 ENCOUNTER — Ambulatory Visit: Payer: Self-pay | Admitting: Obstetrics and Gynecology

## 2024-02-28 LAB — CYTOLOGY - PAP
Adequacy: ABSENT
Comment: NEGATIVE
Diagnosis: NEGATIVE
High risk HPV: NEGATIVE

## 2024-04-29 ENCOUNTER — Emergency Department (HOSPITAL_COMMUNITY)
Admission: EM | Admit: 2024-04-29 | Discharge: 2024-04-29 | Disposition: A | Discharge status: Home / Self Care | Source: Other Acute Inpatient Hospital | Attending: Emergency Medicine | Admitting: Emergency Medicine

## 2024-04-29 ENCOUNTER — Encounter (HOSPITAL_COMMUNITY): Payer: Self-pay | Admitting: Emergency Medicine

## 2024-04-29 ENCOUNTER — Other Ambulatory Visit: Payer: Self-pay

## 2024-04-29 DIAGNOSIS — R55 Syncope and collapse: Secondary | ICD-10-CM | POA: Diagnosis present

## 2024-04-29 DIAGNOSIS — R42 Dizziness and giddiness: Secondary | ICD-10-CM | POA: Diagnosis not present

## 2024-04-29 LAB — COMPREHENSIVE METABOLIC PANEL WITH GFR
ALT: 20 U/L (ref 0–44)
AST: 21 U/L (ref 15–41)
Albumin: 4.3 g/dL (ref 3.5–5.0)
Alkaline Phosphatase: 55 U/L (ref 38–126)
Anion gap: 11 (ref 5–15)
BUN: 19 mg/dL (ref 6–20)
CO2: 20 mmol/L — ABNORMAL LOW (ref 22–32)
Calcium: 9.1 mg/dL (ref 8.9–10.3)
Chloride: 105 mmol/L (ref 98–111)
Creatinine, Ser: 0.74 mg/dL (ref 0.44–1.00)
GFR, Estimated: >60 mL/min (ref >60)
Glucose, Bld: 98 mg/dL (ref 70–99)
Potassium: 4.2 mmol/L (ref 3.5–5.1)
Sodium: 136 mmol/L (ref 135–145)
Total Bilirubin: 0.6 mg/dL (ref 0.0–1.2)
Total Protein: 8.3 g/dL — ABNORMAL HIGH (ref 6.5–8.1)

## 2024-04-29 LAB — CBC WITH DIFFERENTIAL/PLATELET
Abs Immature Granulocytes: 0.03 K/uL (ref 0.00–0.07)
Basophils Absolute: 0 K/uL (ref 0.0–0.1)
Basophils Relative: 0 %
Eosinophils Absolute: 0.1 K/uL (ref 0.0–0.5)
Eosinophils Relative: 1 %
HCT: 45.4 % (ref 36.0–46.0)
Hemoglobin: 14.9 g/dL (ref 12.0–15.0)
Immature Granulocytes: 0 %
Lymphocytes Relative: 16 %
Lymphs Abs: 2.1 K/uL (ref 0.7–4.0)
MCH: 30 pg (ref 26.0–34.0)
MCHC: 32.8 g/dL (ref 30.0–36.0)
MCV: 91.3 fL (ref 80.0–100.0)
Monocytes Absolute: 0.9 K/uL (ref 0.1–1.0)
Monocytes Relative: 7 %
Neutro Abs: 10.2 K/uL — ABNORMAL HIGH (ref 1.7–7.7)
Neutrophils Relative %: 76 %
Platelets: 288 K/uL (ref 150–400)
RBC: 4.97 MIL/uL (ref 3.87–5.11)
RDW: 12.9 % (ref 11.5–15.5)
WBC: 13.3 K/uL — ABNORMAL HIGH (ref 4.0–10.5)
nRBC: 0 % (ref 0.0–0.2)

## 2024-04-29 LAB — CBG MONITORING, ED: Glucose-Capillary: 91 mg/dL (ref 70–99)

## 2024-04-29 LAB — D-DIMER, QUANTITATIVE: D-Dimer, Quant: 0.28 ug{FEU}/mL (ref 0.00–0.50)

## 2024-04-29 LAB — LIPASE, BLOOD: Lipase: 32 U/L (ref 11–51)

## 2024-04-29 NOTE — ED Triage Notes (Signed)
 Pt bib [facility] for near syncope while pt was at work. Per ems pt became pale and dizzy. Also c/o of mid abd pain & nausea.

## 2024-04-29 NOTE — ED Provider Notes (Signed)
 [place] [facility] AT [facility] Provider Note   CSN: [id number] Arrival date & time: 04/29/24  1040     Patient presents with: Near Syncope   Brooke Paul is a 28 y.o. female significant past medical history who presents with concern for a presyncopal episode earlier today.  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  She sat down and the symptoms resolved.  She did not lose consciousness.  Denies any chest pain or shortness of breath during this episode.  Denies any weakness of her upper or lower extremities.  Reports she only ate a banana and orange this morning for breakfast.  Reports she has not been keeping as hydrated as she should be.  Denies any recent illnesses.    Near Syncope       Prior to Admission medications   Medication Sig Start Date End Date Taking? Authorizing Provider  acetaminophen  (TYLENOL ) 500 MG tablet Take 2 tablets (1,000 mg total) by mouth every 6 (six) hours as needed. 02/23/24   Delores Nidia CROME, FNP  norethindrone-ethinyl estradiol-FE (JUNEL FE 1/20) 1-20 MG-MCG tablet Take 1 tablet by mouth daily. 02/23/24   Delores Nidia CROME, FNP    Allergies: Patient has no known allergies.    Review of Systems  Cardiovascular:  Positive for near-syncope.    Updated Vital Signs BP 109/64   Pulse 78   Temp 97.7 F (36.5 C) (Oral)   Resp 19   Ht 5' 4.5 (1.638 m)   Wt 109 kg   SpO2 93%   BMI 40.61 kg/m   Physical Exam Vitals and nursing note reviewed.  Constitutional:      General: She is not in acute distress.    Appearance: She is well-developed. She is obese.  HENT:     Head: Normocephalic and atraumatic.  Eyes:     Extraocular Movements: Extraocular movements intact.     Conjunctiva/sclera: Conjunctivae normal.     Pupils: Pupils are equal, round, and reactive to light.  Cardiovascular:     Rate and Rhythm: Normal rate  and regular rhythm.     Heart sounds: No murmur heard. Pulmonary:     Effort: Pulmonary effort is normal. No respiratory distress.     Breath sounds: Normal breath sounds.  Abdominal:     Palpations: Abdomen is soft.     Tenderness: There is no abdominal tenderness.  Musculoskeletal:        General: No swelling.     Cervical back: Neck supple.  Skin:    General: Skin is warm and dry.     Capillary Refill: Capillary refill takes less than 2 seconds.  Neurological:     General: No focal deficit present.     Mental Status: She is alert.     Comments: Mental status: Alert and oriented to self, place, and month  Speech: Answers questions appropriately  Cranial Nerves: III, IV, VI: EOM intact, Pupils equal round and reactive, no gaze preference or deviation, no nystagmus. V: normal sensation in V1, V2, and V3 segments bilaterally VII: smiles, puffs cheeks, raises eyebrows, and closes eyes without asymmetry.  VIII: normal hearing to speech IX, X: normal palatal elevation, no uvular deviation XI: 5/5 head turn and 5/5 shoulder shrug bilaterally XII: midline tongue protrusion  Motor: 5/5 strength with resisted wrist flexion extension bilaterally, ankle plantarflexion dorsiflexion bilaterally  Sensory: Intact sensation in upper  and lower extremity bilaterally   Gait: Normal   Psychiatric:        Mood and Affect: Mood normal.     (all labs ordered are listed, but only abnormal results are displayed) Labs Reviewed  CBC WITH DIFFERENTIAL/PLATELET - Abnormal; Notable for the following components:      Result Value   WBC 13.3 (*)    Neutro Abs 10.2 (*)    All other components within normal limits  COMPREHENSIVE METABOLIC PANEL WITH GFR - Abnormal; Notable for the following components:   CO2 20 (*)    Total Protein 8.3 (*)    All other components within normal limits  LIPASE, BLOOD  D-DIMER, QUANTITATIVE  PREGNANCY, URINE  URINALYSIS, ROUTINE W REFLEX MICROSCOPIC  CBG  MONITORING, ED    EKG: None  Radiology: No results found.   Procedures   Medications Ordered in the ED - No data to display                                  Medical Decision Making Amount and/or Complexity of Data Reviewed Labs: ordered.     Differential diagnosis includes but is not limited to dehydration, electrolyte abnormality, orthostatic hypotension, hypoglycemia, arrhythmia, DVT, PE  ED Course:  Upon initial evaluation, patient is well-appearing, no acute distress.  Stable vitals.  Reported feeling dizzy and lightheaded earlier today at work when she was standing up, but this resolved when she sat down.  Currently feeling at baseline.  Did not fully pass out.  No neurologic deficits on exam.  Labs Ordered: I Ordered, and personally interpreted labs.  The pertinent results include:   CBC with leukocytosis of 13.3.  No anemia CMP without any electrolyte abnormalities.  No elevation in LFTs or creatinine CBG upon arrival at 91 Lipase within normal limits D-dimer within normal limits   Cardiac Monitoring: / EKG: The patient was maintained on a cardiac monitor.  I personally viewed and interpreted the cardiac monitored which showed an underlying rhythm of: Normal sinus rhythm   Upon re-evaluation, patient is well-appearing with stable vitals.  Discussed that her labs are overall reassuring.  She did have a leukocytosis of 13.3, but denies any infectious symptoms such as fever, chills, cough, abdominal pain, low concern for acute infectious etiology.  No signs of anemia.  Electrolytes within normal limits.  D-dimer within normal limits, low concern for DVT or PE.  Orthostatic vitals are reassuring without any drop in blood pressure upon standing.  Patient was maintained on cardiac monitoring which showed normal sinus rhythm.  She denied any chest pain or shortness of breath during the event, low concern for cardiac etiology.  She was ambulated by NT.  Patient was able to  ambulate, although reportedly got somewhat dizzy with ambulation. No neuro deficits, low concern for CVA/TIA. Oxygen saturations maintained in normal range.  Unclear cause of symptoms, but low concern for emergent pathology. Feel patient is stable and appropriate for discharge home at this time.    Impression: Near syncope  Disposition:  The patient was discharged home with instructions to keep well-hydrated and eat snacks throughout the day to keep blood sugars within normal range.  Follow-up with PCP if she is having recurrent episodes. Return precautions given.   This chart was dictated using voice recognition software, Dragon. Despite the best efforts of this provider to proofread and correct errors, errors may still occur which can change documentation meaning.  Final diagnoses:  Near syncope    ED Discharge Orders     None          Veta Palma, DEVONNA 04/29/24 1532    Suzette Pac, MD 05/01/24 [phone]

## 2024-04-29 NOTE — ED Notes (Signed)
 Ambulated patient around the nursing station sats stayed between 92%-95%.  Pt got dizzy about half way around nurse station, Pt sat in the chair for a minute then was able to return to her room. RN notified

## 2024-04-29 NOTE — Discharge Instructions (Addendum)
 Your blood counts were normal today aside from your white blood cells which were slightly elevated. Sometimes this can indicate infection or inflammation in the body, but this is a non-specific lab.  Your other blood counts are normal.  Your kidney, liver, and pancreas labs are normal.  Your electrolytes are normal.  Your blood test to look for signs of a blood clot was normal.  Your EKG which is your heart's rhythm was normal today.  Your vital signs (blood pressure, heart rate, temperature, oxygen) were normal today.  Please keep well-hydrated at home and continue to eat snacks throughout the day to keep your blood sugar in normal range.  Please follow-up with your PCP if you are having more frequent episodes of feeling faint.  Return to the ER for any seizure-like activity, chest pain, shortness of breath, or any other new or concerning symptoms

## 2024-04-29 NOTE — ED Notes (Signed)
 ..  The patient is A&OX4, ambulatory at d/c with independent steady gait, NAD. Pt verbalized understanding of d/c instructions and follow up care.

## 2024-05-01 LAB — CBG MONITORING, ED: Glucose-Capillary: 104 mg/dL — ABNORMAL HIGH (ref 70–99)

## 2024-05-21 ENCOUNTER — Encounter: Payer: Self-pay | Admitting: Obstetrics and Gynecology

## 2024-05-21 ENCOUNTER — Ambulatory Visit: Admitting: Obstetrics and Gynecology

## 2024-05-21 VITALS — BP 101/69 | Ht 64.2 in | Wt 228.0 lb

## 2024-05-21 DIAGNOSIS — N946 Dysmenorrhea, unspecified: Secondary | ICD-10-CM | POA: Diagnosis not present

## 2024-05-21 DIAGNOSIS — N920 Excessive and frequent menstruation with regular cycle: Secondary | ICD-10-CM | POA: Diagnosis not present

## 2024-05-21 NOTE — Progress Notes (Signed)
   GYN VISIT Patient name: Brooke Paul MRN [id number]  Date of birth: 06/28/1996 Chief Complaint:   Follow-up (Birth control)  History of Present Illness:   Brooke Paul is a 28 y.o. G0P0000 being seen today for follow up after starting birth control pills in May for heavy, painful periods .     Patient's last menstrual period was 05/21/2024. The current method of family planning is OCP (estrogen/progesterone).  Last pap May. Results were: NILM w/ HRHPV negative     01/25/2024    1:11 PM 07/07/2022    2:18 PM  Depression screen PHQ 2/9  Decreased Interest 0 0  Down, Depressed, Hopeless 0 0  PHQ - 2 Score 0 0  Altered sleeping 1   Tired, decreased energy 1   Change in appetite 0   Feeling bad or failure about yourself  1   Trouble concentrating 0   Moving slowly or fidgety/restless 0   Suicidal thoughts 0   PHQ-9 Score 3   Difficult doing work/chores Not difficult at all         01/25/2024    1:12 PM  GAD 7 : Generalized Anxiety Score  Nervous, Anxious, on Edge 0  Control/stop worrying 1  Worry too much - different things 1  Trouble relaxing 0  Restless 0  Easily annoyed or irritable 1  Afraid - awful might happen 1  Total GAD 7 Score 4  Anxiety Difficulty Not difficult at all     Review of Systems:   Pertinent items are noted in HPI Denies fever/chills, dizziness, headaches, visual disturbances, fatigue, shortness of breath, chest pain, abdominal pain, vomiting, abnormal vaginal discharge/itching/odor/irritation, problems with periods, bowel movements, urination, or intercourse unless otherwise stated above.  Pertinent History Reviewed:  Reviewed past medical,surgical, social, obstetrical and family history.  Reviewed problem list, medications and allergies. Physical Assessment:   Vitals:   05/21/24 1419  BP: 101/69  Weight: 103.4 kg  Height: 5' 4.2 (1.631 m)  Body mass index is 38.89 kg/m.       Physical Examination:   General appearance:  alert, well appearing, and in no distress  Mental status: alert, oriented to person, place, and time  Skin: warm & dry   Cardiovascular: normal heart rate noted  Respiratory: normal respiratory effort, no distress  Extremities: no edema   Assessment & Plan:  1. Dysmenorrhea (Primary) Significant improvement in flow & pain with cycles. Happy on Junel 1-20 and desires to continue. No concerns today  2. Menorrhagia with regular cycle   Return in about 1 year (around 05/21/2025) for annual exam .   Elenor Mole, [facility] 05/21/24
